# Patient Record
Sex: Female | Born: 1975
Health system: Southern US, Community
[De-identification: ages and names within clinical notes are randomized; demographics above are authoritative.]

## PROBLEM LIST (undated history)

## (undated) DIAGNOSIS — F419 Anxiety disorder, unspecified: Secondary | ICD-10-CM

## (undated) DIAGNOSIS — R0602 Shortness of breath: Secondary | ICD-10-CM

## (undated) DIAGNOSIS — D649 Anemia, unspecified: Secondary | ICD-10-CM

## (undated) DIAGNOSIS — Z9289 Personal history of other medical treatment: Secondary | ICD-10-CM

## (undated) DIAGNOSIS — K219 Gastro-esophageal reflux disease without esophagitis: Secondary | ICD-10-CM

## (undated) DIAGNOSIS — T7840XA Allergy, unspecified, initial encounter: Secondary | ICD-10-CM

## (undated) DIAGNOSIS — N809 Endometriosis, unspecified: Secondary | ICD-10-CM

## (undated) DIAGNOSIS — R51 Headache: Secondary | ICD-10-CM

## (undated) DIAGNOSIS — R011 Cardiac murmur, unspecified: Secondary | ICD-10-CM

## (undated) HISTORY — DX: Anemia, unspecified: D64.9

## (undated) HISTORY — DX: Personal history of other medical treatment: Z92.89

## (undated) HISTORY — DX: Headache: R51

## (undated) HISTORY — DX: Gastro-esophageal reflux disease without esophagitis: K21.9

## (undated) HISTORY — PX: LAPAROSCOPIC OVARIAN CYSTECTOMY: SHX6248

## (undated) HISTORY — DX: Allergy, unspecified, initial encounter: T78.40XA

## (undated) HISTORY — DX: Cardiac murmur, unspecified: R01.1

## (undated) HISTORY — DX: Endometriosis, unspecified: N80.9

---

## 1998-06-27 ENCOUNTER — Other Ambulatory Visit: Admission: RE | Admit: 1998-06-27 | Discharge: 1998-06-27 | Payer: Self-pay | Admitting: Obstetrics

## 1998-12-28 ENCOUNTER — Other Ambulatory Visit: Admission: RE | Admit: 1998-12-28 | Discharge: 1998-12-28 | Payer: Self-pay | Admitting: Obstetrics

## 2000-05-16 ENCOUNTER — Other Ambulatory Visit: Admission: RE | Admit: 2000-05-16 | Discharge: 2000-05-16 | Payer: Self-pay | Admitting: Obstetrics

## 2000-12-30 ENCOUNTER — Other Ambulatory Visit: Admission: RE | Admit: 2000-12-30 | Discharge: 2000-12-30 | Payer: Self-pay | Admitting: Obstetrics

## 2002-01-21 ENCOUNTER — Emergency Department (HOSPITAL_COMMUNITY): Admission: EM | Admit: 2002-01-21 | Discharge: 2002-01-21 | Payer: Self-pay | Admitting: Emergency Medicine

## 2002-01-27 ENCOUNTER — Emergency Department (HOSPITAL_COMMUNITY): Admission: EM | Admit: 2002-01-27 | Discharge: 2002-01-28 | Payer: Self-pay | Admitting: Emergency Medicine

## 2002-11-25 ENCOUNTER — Emergency Department (HOSPITAL_COMMUNITY): Admission: EM | Admit: 2002-11-25 | Discharge: 2002-11-25 | Payer: Self-pay | Admitting: Emergency Medicine

## 2003-10-07 ENCOUNTER — Encounter: Payer: Self-pay | Admitting: Obstetrics

## 2003-10-07 ENCOUNTER — Ambulatory Visit (HOSPITAL_COMMUNITY): Admission: RE | Admit: 2003-10-07 | Discharge: 2003-10-07 | Payer: Self-pay | Admitting: Obstetrics

## 2005-08-15 ENCOUNTER — Other Ambulatory Visit: Admission: RE | Admit: 2005-08-15 | Discharge: 2005-08-15 | Payer: Self-pay | Admitting: Family Medicine

## 2005-09-11 ENCOUNTER — Ambulatory Visit (HOSPITAL_COMMUNITY): Admission: RE | Admit: 2005-09-11 | Discharge: 2005-09-11 | Payer: Self-pay | Admitting: *Deleted

## 2005-11-15 ENCOUNTER — Encounter: Admission: RE | Admit: 2005-11-15 | Discharge: 2005-11-15 | Payer: Self-pay | Admitting: Family Medicine

## 2007-03-22 ENCOUNTER — Emergency Department (HOSPITAL_COMMUNITY): Admission: EM | Admit: 2007-03-22 | Discharge: 2007-03-22 | Payer: Self-pay | Admitting: Emergency Medicine

## 2008-01-04 ENCOUNTER — Emergency Department (HOSPITAL_COMMUNITY): Admission: EM | Admit: 2008-01-04 | Discharge: 2008-01-04 | Payer: Self-pay | Admitting: Emergency Medicine

## 2008-02-27 ENCOUNTER — Other Ambulatory Visit: Admission: RE | Admit: 2008-02-27 | Discharge: 2008-02-27 | Payer: Self-pay | Admitting: Family Medicine

## 2009-03-28 ENCOUNTER — Other Ambulatory Visit: Admission: RE | Admit: 2009-03-28 | Discharge: 2009-03-28 | Payer: Self-pay | Admitting: Family Medicine

## 2010-02-27 ENCOUNTER — Encounter (INDEPENDENT_AMBULATORY_CARE_PROVIDER_SITE_OTHER): Payer: Self-pay | Admitting: *Deleted

## 2010-03-09 ENCOUNTER — Encounter (INDEPENDENT_AMBULATORY_CARE_PROVIDER_SITE_OTHER): Payer: Self-pay | Admitting: *Deleted

## 2010-03-27 ENCOUNTER — Ambulatory Visit: Payer: Self-pay | Admitting: Gastroenterology

## 2010-03-27 DIAGNOSIS — R1032 Left lower quadrant pain: Secondary | ICD-10-CM | POA: Insufficient documentation

## 2010-03-27 DIAGNOSIS — R142 Eructation: Secondary | ICD-10-CM

## 2010-03-27 DIAGNOSIS — R141 Gas pain: Secondary | ICD-10-CM | POA: Insufficient documentation

## 2010-03-27 DIAGNOSIS — R143 Flatulence: Secondary | ICD-10-CM

## 2010-06-21 ENCOUNTER — Emergency Department (HOSPITAL_COMMUNITY): Admission: EM | Admit: 2010-06-21 | Discharge: 2010-06-21 | Payer: Self-pay | Admitting: Family Medicine

## 2011-01-16 NOTE — Letter (Signed)
Summary: New Patient letter  [facility]  [address] [place], [place] [address]   Phone: [phone]  Fax: [phone]       03/09/2010 MRN: [id number]  [place] [address]   [place], [place]  [address]  Dear Ms. Parsell,  Welcome to the Gastroenterology Division at [facility].    You are scheduled to see Dr.  Russella Dar on 03-27-10 at 3:00p.m. on the 3rd floor at [facility], [address]  We ask that you try to arrive at our office 15 minutes prior to your appointment time to allow for check-in.  We would like you to complete the enclosed self-administered evaluation form prior to your visit and bring it with you on the day of your appointment.  We will review it with you.  Also, please bring a complete list of all your medications or, if you prefer, bring the medication bottles and we will list them.  Please bring your insurance card so that we may make a copy of it.  If your insurance requires a referral to see a specialist, please bring your referral form from your primary care physician.  Co-payments are due at the time of your visit and may be paid by cash, check or credit card.     Your office visit will consist of a consult with your physician (includes a physical exam), any laboratory testing he/she may order, scheduling of any necessary diagnostic testing (e.g. x-ray, ultrasound, CT-scan), and scheduling of a procedure (e.g. Endoscopy, Colonoscopy) if required.  Please allow enough time on your schedule to allow for any/all of these possibilities.    If you cannot keep your appointment, please call [phone] to cancel or reschedule prior to your appointment date.  This allows [place] the opportunity to schedule an appointment for another patient in need of care.  If you do not cancel or reschedule by 5 p.m. the business day prior to your appointment date, you will be charged a $50.00 late cancellation/no-show fee.    Thank you for choosing  [facility] for your medical needs.  We appreciate the opportunity to care for you.  Please visit [place] at our website  to learn more about our practice.                     Sincerely,                                                             The Gastroenterology Division

## 2011-01-16 NOTE — Assessment & Plan Note (Signed)
Summary: abdominal pain--ch.   History of Present Illness Visit Type: new patient  Primary GI MD: Elie Goody MD [facility] Primary Provider: Candyce Churn. Allyne Gee, MD  Requesting Provider: n/a Chief Complaint: Lower abd pain, gas, nausea, and pain when having a bowel movement  History of Present Illness:   This is a 35 year old female, who relates a one month history of left lower quadrant pain, associated with bowel movements. The pain starts just prior to a bowel movement and is relieved within one minute of having a bowel movement. She has a bowel movement about every 2-3 days and this pattern has not changed. She denies straining, change in stool caliber or hematochezia. She has not noted any significant dietary changes. She has had worsening problems with gas and bloating for approximately 4-6 weeks. She takes Gas-X intermittently with some relief of symptoms.   GI Review of Systems    Reports abdominal pain, bloating, and  nausea.     Location of  Abdominal pain: LLQ.    Denies acid reflux, belching, chest pain, dysphagia with liquids, dysphagia with solids, heartburn, loss of appetite, vomiting, vomiting blood, weight loss, and  weight gain.        Denies anal fissure, black tarry stools, change in bowel habit, constipation, diarrhea, diverticulosis, fecal incontinence, heme positive stool, hemorrhoids, irritable bowel syndrome, jaundice, light color stool, liver problems, rectal bleeding, and  rectal pain.   Current Medications (verified): 1)  Aleve 220 Mg Tabs (Naproxen Sodium) .... As Needed For Headaches 2)  Ibuprofen 800 Mg Tabs (Ibuprofen) .... As Needed  Allergies (verified): No Known Drug Allergies  Past History:  Past Medical History: Arthritis Chronic Headaches  Past Surgical History: Unremarkable  Family History: No FH of Colon Cancer: Family History of Colon Polyps:Mother  Family History of Diabetes: Maternal Aunt   Social History: Occupation: [employer] one  child Patient is a former smoker.  Alcohol Use - yes: occ  Illicit Drug Use - no Smoking Status:  quit Drug Use:  no  Review of Systems       The patient complains of fatigue and headaches-new.         The pertinent positives and negatives are noted as above and in the HPI. All other ROS were reviewed and were negative.   Vital Signs:  Patient profile:   35 year old female Height:      64 inches Weight:      212 pounds BMI:     36.52 BSA:     2.01 Pulse rate:   88 / minute Pulse rhythm:   regular BP sitting:   128 / 74  (left arm) Cuff size:   regular  Vitals Entered By: Ok Anis CMA (March 27, 2010 2:40 PM)  Physical Exam  General:  Well developed, well nourished, no acute distress. Head:  Normocephalic and atraumatic. Eyes:  PERRLA, no icterus. Ears:  Normal auditory acuity. Mouth:  No deformity or lesions, dentition normal. Neck:  Supple; no masses or thyromegaly. Lungs:  Clear throughout to auscultation. Heart:  Regular rate and rhythm; no murmurs, rubs,  or bruits. Abdomen:  Soft, nontender and nondistended. No masses, hepatosplenomegaly or hernias noted. Normal bowel sounds. Rectal:  Normal exam. hemocult negative.   Msk:  Symmetrical with no gross deformities. Normal posture. Pulses:  Normal pulses noted. Extremities:  No clubbing, cyanosis, edema or deformities noted. Neurologic:  Alert and  oriented x4;  grossly normal neurologically. Cervical Nodes:  No significant cervical adenopathy. Inguinal Nodes:  No significant inguinal adenopathy. Psych:  Alert and cooperative. Normal mood and affect.  Impression & Recommendations:  Problem # 1:  ABDOMINAL PAIN-LLQ (ICD-789.04) New onset left lower quadrant pain without clear etiology. Rule out diverticulosis, and inflammatory bowel disease and bowel spasm. The risks, benefits and alternatives to sgimoidoscopy with possible biopsy and possible polypectomy were discussed with the patient and they consent to  proceed. The procedure will be scheduled electively.  Problem # 2:  FLATULENCE-GAS-BLOATING (ICD-787.3) Begin a low gas diet and Align once daily. Continue Gas-X q.i.d. p.r.n.  Patient Instructions: 1)  Call back to schedule Flexible Sigmoidoscopy when you find out your availability.  2)  Excessive Gas Diet handout given.  3)  Start Align one tablet by mouth once daily x 1 month.  4)  Copy sent to : Dorothyann Peng, MD 5)  The medication list was reviewed and reconciled.  All changed / newly prescribed medications were explained.  A complete medication list was provided to the patient / caregiver.

## 2011-01-16 NOTE — Letter (Signed)
Summary: New Patient letter  [facility]  [address] [place], [place] [address]   Phone: [phone]  Fax: [phone]       02/27/2010 MRN: [id number]  [address]IS[address] [place], [place]  [address]  Dear Ms. Duke,  Welcome to the Gastroenterology Division at [facility].    You are scheduled to see Dr. Russella Dar on 03-27-10 at 3:00p.m. on the 3rd floor at [facility], [address].  We ask that you try to arrive at our office 15 minutes prior to your appointment time to allow for check-in.  We would like you to complete the enclosed self-administered evaluation form prior to your visit and bring it with you on the day of your appointment.  We will review it with you.  Also, please bring a complete list of all your medications or, if you prefer, bring the medication bottles and we will list them.  Please bring your insurance card so that we may make a copy of it.  If your insurance requires a referral to see a specialist, please bring your referral form from your primary care physician.  Co-payments are due at the time of your visit and may be paid by cash, check or credit card.     Your office visit will consist of a consult with your physician (includes a physical exam), any laboratory testing he/she may order, scheduling of any necessary diagnostic testing (e.g. x-ray, ultrasound, CT-scan), and scheduling of a procedure (e.g. Endoscopy, Colonoscopy) if required.  Please allow enough time on your schedule to allow for any/all of these possibilities.    If you cannot keep your appointment, please call [phone] to cancel or reschedule prior to your appointment date.  This allows [place] the opportunity to schedule an appointment for another patient in need of care.  If you do not cancel or reschedule by 5 p.m. the business day prior to your appointment date, you will be charged a $50.00 late cancellation/no-show fee.    Thank you for  choosing [facility] for your medical needs.  We appreciate the opportunity to care for you.  Please visit [place] at our website  to learn more about our practice.                     Sincerely,                                                             The Gastroenterology Division

## 2011-03-04 LAB — GC/CHLAMYDIA PROBE AMP, GENITAL
Chlamydia, DNA Probe: NEGATIVE
GC Probe Amp, Genital: NEGATIVE

## 2011-03-04 LAB — WET PREP, GENITAL
Clue Cells Wet Prep HPF POC: NONE SEEN
Trich, Wet Prep: NONE SEEN
Yeast Wet Prep HPF POC: NONE SEEN

## 2011-03-04 LAB — POCT URINALYSIS DIP (DEVICE)
Glucose, UA: NEGATIVE mg/dL
Protein, ur: NEGATIVE mg/dL

## 2011-03-04 LAB — URINE CULTURE

## 2011-05-25 ENCOUNTER — Encounter: Payer: Self-pay | Admitting: Internal Medicine

## 2011-05-25 ENCOUNTER — Ambulatory Visit (INDEPENDENT_AMBULATORY_CARE_PROVIDER_SITE_OTHER): Payer: 59 | Admitting: Internal Medicine

## 2011-05-25 DIAGNOSIS — R002 Palpitations: Secondary | ICD-10-CM | POA: Insufficient documentation

## 2011-05-25 DIAGNOSIS — E7800 Pure hypercholesterolemia, unspecified: Secondary | ICD-10-CM | POA: Insufficient documentation

## 2011-05-25 DIAGNOSIS — R42 Dizziness and giddiness: Secondary | ICD-10-CM

## 2011-05-25 DIAGNOSIS — Z136 Encounter for screening for cardiovascular disorders: Secondary | ICD-10-CM

## 2011-05-25 LAB — LIPID PANEL
HDL: 55.1 mg/dL (ref >39.00)
Triglycerides: 39 mg/dL (ref 0.0–149.0)
VLDL: 7.8 mg/dL (ref 0.0–40.0)

## 2011-05-25 LAB — CBC WITH DIFFERENTIAL/PLATELET
Eosinophils Relative: 3.3 % (ref 0.0–5.0)
Lymphocytes Relative: 35.7 % (ref 12.0–46.0)
Lymphs Abs: 1.5 10*3/uL (ref 0.7–4.0)
MCV: 82.3 fl (ref 78.0–100.0)
Monocytes Relative: 7 % (ref 3.0–12.0)
Neutro Abs: 2.2 10*3/uL (ref 1.4–7.7)
Neutrophils Relative %: 53.4 % (ref 43.0–77.0)
Platelets: 224 10*3/uL (ref 150.0–400.0)
RBC: 4.12 Mil/uL (ref 3.87–5.11)
RDW: 13.9 % (ref 11.5–14.6)
WBC: 4.1 10*3/uL — ABNORMAL LOW (ref 4.5–10.5)

## 2011-05-25 LAB — BASIC METABOLIC PANEL
Creatinine, Ser: 0.7 mg/dL (ref 0.4–1.2)
Glucose, Bld: 87 mg/dL (ref 70–99)

## 2011-05-25 LAB — TSH: TSH: 0.78 u[IU]/mL (ref 0.35–5.50)

## 2011-05-25 NOTE — Progress Notes (Signed)
  Subjective:    Patient ID: Theresa Murray, female    DOB: 1976/11/17, 35 y.o.   MRN: [id number]  HPI patient presents to clinic to establish primary care and for evaluation of palpitations and dizziness. Has rare palpitations not associated with presyncope or syncope. Describes the sensation as a skipping. Drinks coffee daily but not to excess. No associated chest pain or shortness of breath though she does have mild shortness of breath with exertion climbing stairs. Has had her cholesterol checked in the past and was told to modify her diet. No medication was recommended. History of anemia in the past which resolved on followup. No gross active bleeding. Does have intermittent episodes of brief dizziness or vertigo approximately several seconds followed by spontaneous resolution. No obvious trigger or alleviating or exacerbating factors. Sees gynecology for Pap smears and is up to date. No complaints  Reviewed past medical history, medications, allergies, past surgical history, social history, and family history  Review of Systems  Constitutional: Negative for fatigue and unexpected weight change.  Respiratory: Positive for shortness of breath. Negative for cough and wheezing.   Cardiovascular: Positive for palpitations. Negative for chest pain.  Neurological: Positive for dizziness. Negative for seizures, syncope and weakness.  All other systems reviewed and are negative.       Objective:   Physical Exam    Physical Exam  [nursing notereviewed. Constitutional:  appears well-developed and well-nourished. No distress.  HENT: Pupils equal round reactive to light, EOM grossly intact. Head: Normocephalic and atraumatic.  Right Ear: Tympanic membrane, external ear and ear canal normal.  Left Ear: Tympanic membrane, external ear and ear canal normal.  Nose: Nose normal.  Mouth/Throat: Oropharynx is clear and moist. No oropharyngeal exudate.  Eyes: Conjunctivae are normal. No scleral icterus.    Neck: Neck supple.  Cardiovascular: Normal rate, regular rhythm and normal heart sounds.  Exam reveals no gallop and no friction rub.   No murmur heard. Pulmonary/Chest: Effort normal and breath sounds normal. No respiratory distress.  no wheezes.  no rales.  Abdomen: Soft, nondistended, nontender to palpation, positive bowel sounds. No masses organomegaly Lymphadenopathy:     no cervical adenopathy.  Neurological:  alert.  Skin: Skin is warm and dry.  not diaphoretic.      Assessment & Plan:

## 2011-05-25 NOTE — Assessment & Plan Note (Signed)
Obtain CBC, Chem-7 and TSH

## 2011-05-25 NOTE — Assessment & Plan Note (Signed)
Obtain fasting profile. Recommended low fat diet, regular aerobic exercise and weight loss.

## 2011-05-25 NOTE — Assessment & Plan Note (Signed)
Current asymptomatic. EKG obtained demonstrates normal sinus rhythm with a rate of approximate sixty-one. Intervals and axis normal. No evidence of arrhythmia or obvious ischemic change. Obtain Chem-7

## 2011-05-29 ENCOUNTER — Telehealth: Payer: Self-pay

## 2011-05-29 NOTE — Telephone Encounter (Signed)
Pt.notified

## 2011-05-29 NOTE — Telephone Encounter (Signed)
Message copied by Beverely Low on Tue May 29, 2011  4:43 PM ------      Message from: Staci Righter      Created: Mon May 28, 2011  7:52 PM       Labs nl

## 2011-09-28 ENCOUNTER — Inpatient Hospital Stay (INDEPENDENT_AMBULATORY_CARE_PROVIDER_SITE_OTHER)
Admission: RE | Admit: 2011-09-28 | Discharge: 2011-09-28 | Disposition: A | Discharge status: Home / Self Care | Payer: 59 | Source: Ambulatory Visit | Attending: Emergency Medicine | Admitting: Emergency Medicine

## 2011-09-28 DIAGNOSIS — R6889 Other general symptoms and signs: Secondary | ICD-10-CM

## 2011-09-28 DIAGNOSIS — T6391XA Toxic effect of contact with unspecified venomous animal, accidental (unintentional), initial encounter: Secondary | ICD-10-CM

## 2011-12-18 HISTORY — PX: LAPAROSCOPIC OVARIAN CYSTECTOMY: SHX6248

## 2012-02-22 ENCOUNTER — Encounter: Payer: Self-pay | Admitting: Internal Medicine

## 2012-02-22 ENCOUNTER — Ambulatory Visit (INDEPENDENT_AMBULATORY_CARE_PROVIDER_SITE_OTHER): Payer: 59 | Admitting: Internal Medicine

## 2012-02-22 DIAGNOSIS — R079 Chest pain, unspecified: Secondary | ICD-10-CM | POA: Insufficient documentation

## 2012-02-22 DIAGNOSIS — D649 Anemia, unspecified: Secondary | ICD-10-CM

## 2012-02-22 DIAGNOSIS — M79602 Pain in left arm: Secondary | ICD-10-CM

## 2012-02-22 DIAGNOSIS — M79609 Pain in unspecified limb: Secondary | ICD-10-CM

## 2012-02-22 DIAGNOSIS — R42 Dizziness and giddiness: Secondary | ICD-10-CM

## 2012-02-22 LAB — POCT HEMOGLOBIN: Hemoglobin: 11.3 g/dL — AB (ref 12.2–16.2)

## 2012-02-22 NOTE — Progress Notes (Signed)
  Subjective:    Patient ID: Theresa Murray, female    DOB: August 05, 1976, 36 y.o.   MRN: [id number]  HPI Patient comes in as new transfer patient transfer  visit . Previous care was  Dr Rodena Medin in June 2012. .  For palpitations that have subsided and had  Unrevealing evaluation with ekg and labs Previous care [facility] with NP 2 or more years ago.  Comes in today  because of New onset mid chest pain a week ago and feels better if  massages the area  Feels more like a Tooth aches ; off and on for 2 weeks no incitors  ? If stress  Left arm feel funny with it and then to right arm.   No weakness SOB or sweating . Right handed.   No nocturnal sx  .  No exercise related.  Not eating related.  "No migraine  " but does have hx of some HA s. Works at [employer] and says Bright.  lights bothers her and gets a mild  ha  But then vertigo described as dizzy but no falling some nausea.   Has eye doc check.  Vertigo lasts 2 days .   Has not often   And takes meds.  Aleve prn with help. Caffine   In am only.   Mom with HT. No cva or heart attacks   Sis well .  Review of Systems Neg fever weight loss vomiting vision changes bleeding . Has hx of ovarian cysts no current issues . Hx of heart murmur.  No heart disease. Allergies and uses nasal steroid for poss ha  dizzy suppression. No osa   Past history family history social history reviewed updated  in the electronic medical record.     Objective:   Physical Exam WDWN in nad HEENT: Normocephalic ;atraumatic , Eyes;  PERRL, EOMs  Full, lids and conjunctiva clear,,Ears: no deformities, canals nl, TM landmarks normal, Nose: no deformity or discharge mild congestion  Mouth : OP clear without lesion or edema . Neck: Supple without adenopathy or masses or bruits Chest:  Clear to A&P without wheezes rales or rhonchi  ? Tenderness around left parasternal area  CV:  S1-S2 no gallops or murmurs peripheral perfusion is normal Breast:  Pendulous breasts normal by  inspection . No dimpling, discharge, masses, tenderness or discharge . Abdomen:  Sof,t normal bowel sounds without hepatosplenomegaly, no guarding rebound or masses no CVA tenderness NEURO: oriented x 3 CN 3-12 appear intact. No focal muscle weakness or atrophy. DTRs symmetrical. UE  Gait WNL.  Grossly non focal. No tremor or abnormal movement. EKG nsr      Assessment & Plan:  Chest pain atypical  Doesn't sound cv pulm  But get c xray  ? If ms ? cw ? Gynecomastia contributing  Arm pains  Also . 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 Again normal exam   Anemia from last labs  Will recheck

## 2012-02-22 NOTE — Patient Instructions (Signed)
Her chest pain does not seem to be related to heart disease and not lung disease but we should get a chest x-ray to be sure. It is possible you have a pinched nerve down your arm and even chest wall pain is a possibility.    At this time I recommend anti-inflammatory medication for week or so to take 2 Aleve twice a day.   And if not improving in the next few weeks She returned for followup.  Her last laboratory studies were normal except for mild anemia. We should follow this up also  The sensitivity to light still sounds like some type of migraine variant.  I would have you try these spells and episodes including her headaches and come back with the information.  Avoid excess caffeine and alcohol.

## 2012-02-23 ENCOUNTER — Encounter: Payer: Self-pay | Admitting: Internal Medicine

## 2012-02-23 DIAGNOSIS — R42 Dizziness and giddiness: Secondary | ICD-10-CM | POA: Insufficient documentation

## 2012-02-25 ENCOUNTER — Encounter: Payer: Self-pay | Admitting: *Deleted

## 2012-02-25 NOTE — Progress Notes (Signed)
Quick Note:    Letter sent to pt.  ______

## 2012-02-27 ENCOUNTER — Telehealth: Payer: Self-pay | Admitting: Family Medicine

## 2012-02-27 NOTE — Telephone Encounter (Signed)
Misty Stanley from [facility] radiology called. She wanted you to know that this patient never came in to get the films you ordered on 3/8. Thank you.

## 2012-02-27 NOTE — Telephone Encounter (Signed)
Per Dr. Fabian Sharp- call pt to see how she is doing and did she get a x-ray done.

## 2012-02-28 NOTE — Telephone Encounter (Signed)
I tried to call pt on both home and cell number but couldn't leave a message. I mailed pt a letter to call office.

## 2012-03-18 NOTE — Telephone Encounter (Signed)
FYI:  Pt states she will go get chest x-ray on Friday 03/21/12.

## 2012-03-25 ENCOUNTER — Encounter: Payer: Self-pay | Admitting: Internal Medicine

## 2012-03-25 ENCOUNTER — Ambulatory Visit (INDEPENDENT_AMBULATORY_CARE_PROVIDER_SITE_OTHER): Payer: 59 | Admitting: Internal Medicine

## 2012-03-25 ENCOUNTER — Ambulatory Visit: Payer: 59 | Admitting: Internal Medicine

## 2012-03-25 VITALS — BP 104/78 | HR 77 | Temp 98.3°F | Wt 212.0 lb

## 2012-03-25 DIAGNOSIS — J069 Acute upper respiratory infection, unspecified: Secondary | ICD-10-CM

## 2012-03-25 DIAGNOSIS — J309 Allergic rhinitis, unspecified: Secondary | ICD-10-CM

## 2012-03-25 MED ORDER — FLUTICASONE FUROATE 27.5 MCG/SPRAY NA SUSP: 2.0000 | Freq: Every day | NASAL | Status: DC

## 2012-03-25 MED ORDER — HYDROCODONE-HOMATROPINE 5-1.5 MG/5ML PO SYRP: 5.0000 mL | ORAL_SOLUTION | ORAL | Status: AC | PRN

## 2012-03-25 NOTE — Progress Notes (Signed)
  Subjective:    Patient ID: Theresa Murray, female    DOB: 1975/12/30, 36 y.o.   MRN: [id number]  HPI Patient comes in today for SDA for  new problem evaluation. Onset of  5 days of ur congestion hoarseness and some cough  No fever cp sob itchy throat  Some green drainage  No face pain. No nose itching .   Using mucinex  Still present  Hasnt been using veramyst as is out of this .    Review of Systems Neg fever cp sob  No NVD new rash or swelling Past history family history social history reviewed in the electronic medical record.     Objective:   Physical Exam BP 104/78  Pulse 77  Temp(Src) 98.3 F (36.8 C) (Oral)  Wt 212 lb (96.163 kg)  SpO2 99%  LMP 03/21/2012 WDWN in NAD  quiet respirations; mildly congested  somewhat hoarse. Non toxic . HEENT: Normocephalic ;atraumatic , Eyes;  PERRL, EOMs  Full, lids and conjunctiva clear,,Ears: no deformities, canals nl, TM landmarks normal, Nose: no deformity or discharge but congested;face non  tender Mouth : OP clear without lesion or edema . Neck: Supple without adenopathy or masses or bruits Chest:  Clear to A&P without wheezes rales or rhonchi CV:  S1-S2 no gallops or murmurs peripheral perfusion is normal Skin :nl perfusion and no acute rashes      Assessment & Plan:   Acute URI prob viral however may have allergy component  With the itchy throat sx  and context of high pollen counts    Counseled. At length about viral infections and allergy  No indication for antibiotic at this time but if  persistent or progressive  Or alarm features will follow up . Refill veramyst and restart.  Cough med as needed.

## 2012-03-25 NOTE — Patient Instructions (Addendum)
I believe that you had a viral respiratory infection that may last another week or so;  should get better on its own but we can try some things to make you  more comfortable  In the meantime. You should contact us if you have significant fever shortness of breath not related to coughing ,wheezing or other concerns. Antibiotics will not help a viral respiratory infection. They  only help kill  bacteria of certain types. Can be harmful in some situations.   You probably also has some allergy involved in your symptoms. Take the veramyst every day  And continue on antihistamine such as allegra zyrtec or claritin.  Can use cough med at night for comfort . will not cure the cough .   Warm or hot liquids can help soothe the throat until you get better.   Laryngitis At the top of your windpipe is your voice box. It is the source of your voice. Inside your voice box are 2 bands of muscles called vocal cords. When you breathe, your vocal cords are relaxed and open so that air can get into the lungs. When you decide to say something, these cords come together and vibrate. The sound from these vibrations goes into your throat and comes out through your mouth as sound. Laryngitis is an inflammation of the vocal cords that causes hoarseness, cough, loss of voice, sore throat, and dry throat. Laryngitis can be temporary (acute) or long-term (chronic). Most cases of acute laryngitis improve with time.Chronic laryngitis lasts for more than 3 weeks. CAUSES Laryngitis can often be related to excessive smoking, talking, or yelling, as well as inhalation of toxic fumes and allergies. Acute laryngitis is usually caused by a viral infection, vocal strain, measles or mumps, or bacterial infections. Chronic laryngitis is usually caused by vocal cord strain, vocal cord injury, postnasal drip, growths on the vocal cords, or acid reflux. SYMPTOMS   Cough.   Sore throat.   Dry throat.  RISK FACTORS  Respiratory  infections.   Exposure to irritating substances, such as cigarette smoke, excessive amounts of alcohol, stomach acids, and workplace chemicals.   Voice trauma, such as vocal cord injury from shouting or speaking too loud.  DIAGNOSIS  Your cargiver will perform a physical exam. During the physical exam, your caregiver will examine your throat. The most common sign of laryngitis is hoarseness. Laryngoscopy may be necessary to confirm the diagnosis of this condition. This procedure allows your caregiver to look into the larynx. HOME CARE INSTRUCTIONS  Drink enough fluids to keep your urine clear or pale yellow.   Rest until you no longer have symptoms or as directed by your caregiver.   Breathe in moist air.   Take all medicine as directed by your caregiver.   Do not smoke.   Talk as little as possible (this includes whispering).   Write on paper instead of talking until your voice is back to normal.   Follow up with your caregiver if your condition has not improved after 10 days.  SEEK MEDICAL CARE IF:   You have trouble breathing.   You cough up blood.   You have persistent fever.   You have increasing pain.   You have difficulty swallowing.  MAKE SURE YOU:  Understand these instructions.   Will watch your condition.   Will get help right away if you are not doing well or get worse.  Document Released: 12/03/2005 Document Revised: 11/22/2011 Document Reviewed: 02/08/2011 [facility] Patient Information 2012 [place], [place].  Acute Bronchitis Acute bronchitis is usually  a chest cold. The airways in your lungs are red and sore (inflamed). Acute means it is sudden onset.  CAUSES Bronchitis is most often caused by the same virus that causes a cold. SYMPTOMS   Body aches.   Chest congestion.   Chills.   Cough.   Fever.   Shortness of breath.   Sore throat.  TREATMENT  Acute bronchitis is usually treated with rest, fluids, and medicines for relief of fever or  cough. Most symptoms should go away after a few days or a week. Increased fluids may help thin your secretions and will prevent dehydration. Your caregiver may give you an inhaler to improve your symptoms. The inhaler reduces shortness of breath and helps control cough. You can take over-the-counter pain relievers or cough medicine to decrease coughing, pain, or fever. A cool-air vaporizer may help thin bronchial secretions and make it easier to clear your chest. Antibiotics are usually not needed but can be prescribed if you smoke, are seriously ill, have chronic lung problems, are elderly, or you are at higher risk for developing complications.Allergies and asthma can make bronchitis worse. Repeated episodes of bronchitis may cause longstanding lung problems. Avoid smoking and secondhand smoke.Exposure to cigarette smoke or irritating chemicals will make bronchitis worse. If you are a cigarette smoker, consider using nicotine gum or skin patches to help control withdrawal symptoms. Quitting smoking will help your lungs heal faster. Recovery from bronchitis is often slow, but you should start feeling better after 2 to 3 days. Cough from bronchitis frequently lasts for 3 to 4 weeks. To prevent another bout of acute bronchitis:  Quit smoking.   Wash your hands frequently to get rid of viruses or use a hand sanitizer.   Avoid other people with cold or virus symptoms.   Try not to touch your hands to your mouth, nose, or eyes.  SEEK IMMEDIATE MEDICAL CARE IF:  You develop increased fever, chills, or chest pain.   You have severe shortness of breath or bloody sputum.   You develop dehydration, fainting, repeated vomiting, or a severe headache.   You have no improvement after 1 week of treatment or you get worse.  MAKE SURE YOU:   Understand these instructions.   Will watch your condition.   Will get help right away if you are not doing well or get worse.  Document Released: 01/10/2005  Document Revised: 11/22/2011 Document Reviewed: 03/28/2011 [facility] Patient Information 2012 [place], [place].

## 2012-03-27 ENCOUNTER — Ambulatory Visit: Payer: 59 | Admitting: Internal Medicine

## 2012-03-30 DIAGNOSIS — J069 Acute upper respiratory infection, unspecified: Secondary | ICD-10-CM | POA: Insufficient documentation

## 2012-03-30 DIAGNOSIS — J309 Allergic rhinitis, unspecified: Secondary | ICD-10-CM | POA: Insufficient documentation

## 2012-05-14 ENCOUNTER — Telehealth: Payer: Self-pay | Admitting: Family Medicine

## 2012-05-14 ENCOUNTER — Encounter (HOSPITAL_COMMUNITY): Payer: Self-pay | Admitting: Pharmacist

## 2012-05-14 NOTE — Telephone Encounter (Signed)
Patient states she was fine and will call back because she was waiting on her daughter

## 2012-05-14 NOTE — Telephone Encounter (Signed)
We received notification that this patient did not get the chest xray that was ordered 02/22/12. Thank you.

## 2012-05-14 NOTE — Telephone Encounter (Signed)
Noted canceled FU visit and no cx ray. From  March April  Contact patient and assess status to document how she is doing.  .   If her pain is gone and no cough or chronic resp sx then we can see her prn or when needs preventive visit.

## 2012-05-29 ENCOUNTER — Other Ambulatory Visit: Payer: Self-pay | Admitting: Obstetrics and Gynecology

## 2012-06-04 ENCOUNTER — Encounter (HOSPITAL_COMMUNITY): Payer: Self-pay

## 2012-06-04 ENCOUNTER — Encounter (HOSPITAL_COMMUNITY)
Admission: RE | Admit: 2012-06-04 | Discharge: 2012-06-04 | Disposition: A | Discharge status: Home / Self Care | Payer: 59 | Source: Ambulatory Visit | Attending: Obstetrics and Gynecology | Admitting: Obstetrics and Gynecology

## 2012-06-04 HISTORY — DX: Shortness of breath: R06.02

## 2012-06-04 HISTORY — DX: Anxiety disorder, unspecified: F41.9

## 2012-06-04 LAB — CBC
Hemoglobin: 11.5 g/dL — ABNORMAL LOW (ref 12.0–15.0)
MCH: 25.3 pg — ABNORMAL LOW (ref 26.0–34.0)
MCHC: 32.3 g/dL (ref 30.0–36.0)
MCV: 78.4 fL (ref 78.0–100.0)
RBC: 4.54 MIL/uL (ref 3.87–5.11)
RDW: 14.3 % (ref 11.5–15.5)
WBC: 6.3 10*3/uL (ref 4.0–10.5)

## 2012-06-04 LAB — SURGICAL PCR SCREEN: Staphylococcus aureus: NEGATIVE

## 2012-06-04 NOTE — Patient Instructions (Addendum)
YOUR PROCEDURE IS SCHEDULED ON: 06/10/12  ENTER THROUGH THE MAIN ENTRANCE OF [facility] AT: 1130am  USE DESK PHONE AND DIAL [phone] TO INFORM us OF YOUR ARRIVAL  CALL [phone] IF YOU HAVE ANY QUESTIONS OR PROBLEMS PRIOR TO YOUR ARRIVAL.  REMEMBER: DO NOT EAT AFTER MIDNIGHT : Monday  SPECIAL INSTRUCTIONS:see clear liquids sheet   YOU MAY BRUSH YOUR TEETH THE MORNING OF SURGERY   TAKE THESE MEDICINES THE DAY OF SURGERY WITH SIP OF WATER:none   DO NOT WEAR JEWELRY, EYE MAKEUP, LIPSTICK OR DARK FINGERNAIL POLISH DO NOT WEAR BODY LOTIONS  DO NOT SHAVE FOR 48 HOURS PRIOR TO SURGERY  YOU WILL NOT BE ALLOWED TO DRIVE YOURSELF HOME.  NAME OF DRIVER: Audie Clear- [phone]

## 2012-06-10 ENCOUNTER — Encounter (HOSPITAL_COMMUNITY): Payer: Self-pay | Admitting: *Deleted

## 2012-06-10 ENCOUNTER — Ambulatory Visit (HOSPITAL_COMMUNITY): Payer: 59 | Admitting: Anesthesiology

## 2012-06-10 ENCOUNTER — Encounter (HOSPITAL_COMMUNITY): Payer: Self-pay | Admitting: Anesthesiology

## 2012-06-10 ENCOUNTER — Encounter (HOSPITAL_COMMUNITY)
Admission: RE | Disposition: A | Discharge status: Home / Self Care | Payer: Self-pay | Source: Ambulatory Visit | Attending: Obstetrics and Gynecology

## 2012-06-10 ENCOUNTER — Ambulatory Visit (HOSPITAL_COMMUNITY)
Admission: RE | Admit: 2012-06-10 | Discharge: 2012-06-10 | Disposition: A | Discharge status: Home / Self Care | Payer: 59 | Source: Ambulatory Visit | Attending: Obstetrics and Gynecology | Admitting: Obstetrics and Gynecology

## 2012-06-10 DIAGNOSIS — Z01812 Encounter for preprocedural laboratory examination: Secondary | ICD-10-CM | POA: Insufficient documentation

## 2012-06-10 DIAGNOSIS — N803 Endometriosis of pelvic peritoneum, unspecified: Secondary | ICD-10-CM | POA: Insufficient documentation

## 2012-06-10 DIAGNOSIS — N801 Endometriosis of ovary: Secondary | ICD-10-CM | POA: Insufficient documentation

## 2012-06-10 DIAGNOSIS — N80109 Endometriosis of ovary, unspecified side, unspecified depth: Secondary | ICD-10-CM | POA: Insufficient documentation

## 2012-06-10 DIAGNOSIS — Z9889 Other specified postprocedural states: Secondary | ICD-10-CM

## 2012-06-10 DIAGNOSIS — Z01818 Encounter for other preprocedural examination: Secondary | ICD-10-CM | POA: Insufficient documentation

## 2012-06-10 LAB — PREGNANCY, URINE: Preg Test, Ur: NEGATIVE

## 2012-06-10 SURGERY — ROBOTIC ASSISTED LAPAROSCOPIC OVARIAN CYSTECTOMY
Anesthesia: General | Site: Abdomen | Laterality: Right | Wound class: Clean Contaminated

## 2012-06-10 MED ORDER — MIDAZOLAM HCL 5 MG/5ML IJ SOLN
INTRAMUSCULAR | Status: DC | PRN
  Administered 2012-06-10: 2 mg via INTRAVENOUS

## 2012-06-10 MED ORDER — BUPIVACAINE HCL (PF) 0.25 % IJ SOLN
INTRAMUSCULAR | Status: DC | PRN
  Administered 2012-06-10: 8 mL

## 2012-06-10 MED ORDER — LIDOCAINE HCL (CARDIAC) 20 MG/ML IV SOLN
INTRAVENOUS | Status: AC
  Filled 2012-06-10: qty 5

## 2012-06-10 MED ORDER — DEXAMETHASONE SODIUM PHOSPHATE 10 MG/ML IJ SOLN
INTRAMUSCULAR | Status: AC
  Filled 2012-06-10: qty 1

## 2012-06-10 MED ORDER — HYDROCODONE-ACETAMINOPHEN 5-500 MG PO TABS: 1.0000 | ORAL_TABLET | Freq: Four times a day (QID) | ORAL | Status: AC | PRN

## 2012-06-10 MED ORDER — MEPERIDINE HCL 25 MG/ML IJ SOLN: 6.2500 mg | INTRAMUSCULAR | Status: DC | PRN

## 2012-06-10 MED ORDER — HYDROCODONE-ACETAMINOPHEN 5-325 MG PO TABS
1.0000 | ORAL_TABLET | Freq: Once | ORAL | Status: AC
  Administered 2012-06-10: 1 via ORAL

## 2012-06-10 MED ORDER — DEXAMETHASONE SODIUM PHOSPHATE 4 MG/ML IJ SOLN
INTRAMUSCULAR | Status: DC | PRN
  Administered 2012-06-10: 4 mg via INTRAVENOUS
  Administered 2012-06-10: 10 mg via INTRAVENOUS

## 2012-06-10 MED ORDER — ONDANSETRON HCL 4 MG/2ML IJ SOLN
INTRAMUSCULAR | Status: AC
  Filled 2012-06-10: qty 2

## 2012-06-10 MED ORDER — NEOSTIGMINE METHYLSULFATE 1 MG/ML IJ SOLN
INTRAMUSCULAR | Status: DC | PRN
  Administered 2012-06-10: 2 mg via INTRAVENOUS

## 2012-06-10 MED ORDER — FENTANYL CITRATE 0.05 MG/ML IJ SOLN
INTRAMUSCULAR | Status: DC | PRN
  Administered 2012-06-10: 50 ug via INTRAVENOUS
  Administered 2012-06-10: 100 ug via INTRAVENOUS
  Administered 2012-06-10: 150 ug via INTRAVENOUS

## 2012-06-10 MED ORDER — GLYCOPYRROLATE 0.2 MG/ML IJ SOLN
INTRAMUSCULAR | Status: DC | PRN
  Administered 2012-06-10: 0.2 mg via INTRAVENOUS

## 2012-06-10 MED ORDER — SCOPOLAMINE 1 MG/3DAYS TD PT72
1.0000 | MEDICATED_PATCH | TRANSDERMAL | Status: DC
  Administered 2012-06-10: 1.5 mg via TRANSDERMAL

## 2012-06-10 MED ORDER — BUPIVACAINE HCL (PF) 0.25 % IJ SOLN
INTRAMUSCULAR | Status: AC
  Filled 2012-06-10: qty 30

## 2012-06-10 MED ORDER — PROPOFOL 10 MG/ML IV EMUL
INTRAVENOUS | Status: DC | PRN
  Administered 2012-06-10: 200 mg via INTRAVENOUS

## 2012-06-10 MED ORDER — ONDANSETRON HCL 4 MG/2ML IJ SOLN: INTRAMUSCULAR | Status: DC | PRN

## 2012-06-10 MED ORDER — PROPOFOL 10 MG/ML IV EMUL
INTRAVENOUS | Status: AC
  Filled 2012-06-10: qty 20

## 2012-06-10 MED ORDER — KETOROLAC TROMETHAMINE 30 MG/ML IJ SOLN: 15.0000 mg | Freq: Once | INTRAMUSCULAR | Status: DC | PRN

## 2012-06-10 MED ORDER — ROCURONIUM BROMIDE 50 MG/5ML IV SOLN
INTRAVENOUS | Status: AC
  Filled 2012-06-10: qty 1

## 2012-06-10 MED ORDER — IBUPROFEN 800 MG PO TABS: 800.0000 mg | ORAL_TABLET | Freq: Three times a day (TID) | ORAL | Status: AC | PRN

## 2012-06-10 MED ORDER — SCOPOLAMINE 1 MG/3DAYS TD PT72
MEDICATED_PATCH | TRANSDERMAL | Status: AC
  Filled 2012-06-10: qty 1

## 2012-06-10 MED ORDER — ONDANSETRON HCL 4 MG/2ML IJ SOLN: 4.0000 mg | Freq: Once | INTRAMUSCULAR | Status: DC | PRN

## 2012-06-10 MED ORDER — FENTANYL CITRATE 0.05 MG/ML IJ SOLN
INTRAMUSCULAR | Status: AC
  Filled 2012-06-10: qty 2

## 2012-06-10 MED ORDER — LACTATED RINGERS IR SOLN
Status: DC | PRN
  Administered 2012-06-10: 3000 mL

## 2012-06-10 MED ORDER — FENTANYL CITRATE 0.05 MG/ML IJ SOLN: 25.0000 ug | INTRAMUSCULAR | Status: DC | PRN

## 2012-06-10 MED ORDER — KETOROLAC TROMETHAMINE 30 MG/ML IJ SOLN
INTRAMUSCULAR | Status: DC | PRN
  Administered 2012-06-10: 30 mg via INTRAVENOUS

## 2012-06-10 MED ORDER — FENTANYL CITRATE 0.05 MG/ML IJ SOLN
INTRAMUSCULAR | Status: AC
  Filled 2012-06-10: qty 5

## 2012-06-10 MED ORDER — LACTATED RINGERS IV SOLN
INTRAVENOUS | Status: DC
  Administered 2012-06-10 (×3): via INTRAVENOUS

## 2012-06-10 MED ORDER — ROCURONIUM BROMIDE 100 MG/10ML IV SOLN
INTRAVENOUS | Status: DC | PRN
  Administered 2012-06-10: 50 mg via INTRAVENOUS
  Administered 2012-06-10: 5 mg via INTRAVENOUS

## 2012-06-10 MED ORDER — LIDOCAINE HCL (CARDIAC) 20 MG/ML IV SOLN
INTRAVENOUS | Status: DC | PRN
  Administered 2012-06-10: 50 mg via INTRAVENOUS

## 2012-06-10 MED ORDER — HYDROCODONE-ACETAMINOPHEN 5-325 MG PO TABS
ORAL_TABLET | ORAL | Status: AC
  Administered 2012-06-10: 1 via ORAL
  Filled 2012-06-10: qty 1

## 2012-06-10 MED ORDER — MIDAZOLAM HCL 2 MG/2ML IJ SOLN
INTRAMUSCULAR | Status: AC
  Filled 2012-06-10: qty 2

## 2012-06-10 SURGICAL SUPPLY — 70 items
ADH SKN CLS APL DERMABOND .7 (GAUZE/BANDAGES/DRESSINGS) ×1
BAG URINE DRAINAGE (UROLOGICAL SUPPLIES) ×2 IMPLANT
BARRIER ADHS 3X4 INTERCEED (GAUZE/BANDAGES/DRESSINGS) ×2 IMPLANT
BRR ADH 4X3 ABS CNTRL BYND (GAUZE/BANDAGES/DRESSINGS) ×1
CABLE HIGH FREQUENCY MONO STRZ (ELECTRODE) ×2 IMPLANT
CATH FOLEY 3WAY  5CC 16FR (CATHETERS) ×1
CATH FOLEY 3WAY 5CC 16FR (CATHETERS) ×1 IMPLANT
CHLORAPREP W/TINT 26ML (MISCELLANEOUS) ×2 IMPLANT
CONT PATH 16OZ SNAP LID 3702 (MISCELLANEOUS) ×2 IMPLANT
COVER MAYO STAND STRL (DRAPES) ×2 IMPLANT
COVER TABLE BACK 60X90 (DRAPES) ×4 IMPLANT
COVER TIP SHEARS 8 DVNC (MISCELLANEOUS) ×1 IMPLANT
COVER TIP SHEARS 8MM DA VINCI (MISCELLANEOUS) ×1
DECANTER SPIKE VIAL GLASS SM (MISCELLANEOUS) ×2 IMPLANT
DERMABOND ADVANCED (GAUZE/BANDAGES/DRESSINGS) ×1
DERMABOND ADVANCED .7 DNX12 (GAUZE/BANDAGES/DRESSINGS) ×1 IMPLANT
DRAPE HUG U DISPOSABLE (DRAPE) ×2 IMPLANT
DRAPE LG THREE QUARTER DISP (DRAPES) ×4 IMPLANT
DRAPE MONITOR DA VINCI (DRAPE) IMPLANT
DRAPE WARM FLUID 44X44 (DRAPE) ×2 IMPLANT
ELECT REM PT RETURN 9FT ADLT (ELECTROSURGICAL) ×2
ELECTRODE REM PT RTRN 9FT ADLT (ELECTROSURGICAL) ×1 IMPLANT
EVACUATOR SMOKE 8.L (FILTER) ×2 IMPLANT
GAUZE VASELINE 3X9 (GAUZE/BANDAGES/DRESSINGS) IMPLANT
GLOVE BIO SURGEON STRL SZ 6.5 (GLOVE) ×5 IMPLANT
GLOVE BIOGEL PI IND STRL 7.0 (GLOVE) ×3 IMPLANT
GLOVE BIOGEL PI INDICATOR 7.0 (GLOVE) ×3
GOWN STRL REIN XL XLG (GOWN DISPOSABLE) ×14 IMPLANT
HEMOSTAT SURGICEL 2X3 (HEMOSTASIS) ×1 IMPLANT
HEMOSTAT SURGICEL 4X8 (HEMOSTASIS) ×1 IMPLANT
KIT ACCESSORY DA VINCI DISP (KITS) ×1
KIT ACCESSORY DVNC DISP (KITS) ×1 IMPLANT
KIT DISP ACCESSORY 4 ARM (KITS) IMPLANT
LEGGING LITHOTOMY PAIR STRL (DRAPES) ×2 IMPLANT
NDL INSUFFLATION 14GA 120MM (NEEDLE) ×1 IMPLANT
NDL INSUFFLATION 14GA 150MM (NEEDLE) IMPLANT
NEEDLE INSUFFLATION 14GA 120MM (NEEDLE) ×2 IMPLANT
NEEDLE INSUFFLATION 14GA 150MM (NEEDLE) ×2 IMPLANT
NS IRRIG 1000ML POUR BTL (IV SOLUTION) ×3 IMPLANT
OCCLUDER COLPOPNEUMO (BALLOONS) ×1 IMPLANT
PACK LAVH (CUSTOM PROCEDURE TRAY) ×2 IMPLANT
PAD PREP 24X48 CUFFED NSTRL (MISCELLANEOUS) ×4 IMPLANT
PLUG CATH AND CAP STER (CATHETERS) ×2 IMPLANT
PROTECTOR NERVE ULNAR (MISCELLANEOUS) ×4 IMPLANT
SCISSORS LAP 5X35 DISP (ENDOMECHANICALS) IMPLANT
SET CYSTO W/LG BORE CLAMP LF (SET/KITS/TRAYS/PACK) IMPLANT
SET IRRIG TUBING LAPAROSCOPIC (IRRIGATION / IRRIGATOR) ×2 IMPLANT
SOLUTION ELECTROLUBE (MISCELLANEOUS) ×2 IMPLANT
SPONGE LAP 18X18 X RAY DECT (DISPOSABLE) IMPLANT
SUT VIC AB 0 CT1 27 (SUTURE) ×6
SUT VIC AB 0 CT1 27XBRD ANTBC (SUTURE) ×3 IMPLANT
SUT VICRYL 0 UR6 27IN ABS (SUTURE) ×2 IMPLANT
SUT VICRYL 4-0 PS2 18IN ABS (SUTURE) ×4 IMPLANT
SYR 50ML LL SCALE MARK (SYRINGE) ×2 IMPLANT
SYSTEM CONVERTIBLE TROCAR (TROCAR) IMPLANT
TIP UTERINE 5.1X6CM LAV DISP (MISCELLANEOUS) IMPLANT
TIP UTERINE 6.7X10CM GRN DISP (MISCELLANEOUS) IMPLANT
TIP UTERINE 6.7X6CM WHT DISP (MISCELLANEOUS) IMPLANT
TIP UTERINE 6.7X8CM BLUE DISP (MISCELLANEOUS) IMPLANT
TOWEL OR 17X24 6PK STRL BLUE (TOWEL DISPOSABLE) ×6 IMPLANT
TRAY FOLEY BAG SILVER LF 14FR (CATHETERS) ×2 IMPLANT
TROCAR 12M 150ML BLUNT (TROCAR) IMPLANT
TROCAR DISP BLADELESS 8 DVNC (TROCAR) ×1 IMPLANT
TROCAR DISP BLADELESS 8MM (TROCAR) ×1
TROCAR XCEL NON-BLD 5MMX100MML (ENDOMECHANICALS) ×2 IMPLANT
TROCAR Z-THREAD 12X150 (TROCAR) ×2 IMPLANT
TROCAR Z-THREAD BLADED 12X100M (TROCAR) ×1 IMPLANT
TUBING FILTER THERMOFLATOR (ELECTROSURGICAL) ×2 IMPLANT
WARMER LAPAROSCOPE (MISCELLANEOUS) ×2 IMPLANT
WATER STERILE IRR 1000ML POUR (IV SOLUTION) ×3 IMPLANT

## 2012-06-10 NOTE — Brief Op Note (Signed)
06/10/2012  5:10 PM  PATIENT:  Theresa Murray  36 y.o. female  PRE-OPERATIVE DIAGNOSIS:  Complex right ovarian cyst  POST-OPERATIVE DIAGNOSIS: Right endometrioma, Stage II pelvic endometriosis  PROCEDURE:  Procedure(s) (LRB): DAVINCI ROBOTIC ASSISTED LAPAROSCOPIC OVARIAN CYSTECTOMY (Right), EXCISION/RESECTION OF PELVIC ENDOMETRIOSIS  SURGEON:  Surgeon(s) and Role:    * Izayiah Tibbitts A Tramel Westbrook, MD - Primary  PHYSICIAN ASSISTANT:   ASSISTANTS: Marlinda Mike, CNM   ANESTHESIA:   general  EBL:  Total I/O In: 1400 [I.V.:1400] Out: -  FIINDINGS: right ovarian endometrioma, ovaries adherent to post uterus/uterosacral ligaments and pelvic sidewall, small SS fibroid, left paratubal cyst, elongated appendix, nl liver edge, omental adhesion right lat/ant abd wall  BLOOD ADMINISTERED:none  DRAINS: none   LOCAL MEDICATIONS USED:  MARCAINE     SPECIMEN:  Source of Specimen:  right ovarian cyst wall, posterior pelvic endometriosis implants  DISPOSITION OF SPECIMEN:  PATHOLOGY  COUNTS:  YES  TOURNIQUET:  * No tourniquets in log *  DICTATION: .Other Dictation: Dictation Number [id number]  PLAN OF CARE: Discharge to home after PACU  PATIENT DISPOSITION:  PACU - hemodynamically stable.   Delay start of Pharmacological VTE agent (>24hrs) due to surgical blood loss or risk of bleeding: no

## 2012-06-10 NOTE — Discharge Instructions (Addendum)
DISCHARGE INSTRUCTIONS: Laparoscopy  The following instructions have been prepared to help you care for yourself upon your return home today.  MAY TAKE IBUPROFEN/MOTRIN/ ADVIL PRODUCTS AFTER 9:00P.M. AS NEEDED FOR CRAMPS/PAIN  Wound care: Marland Kitchen Do not get the incision wet for the first 24 hours. The incision should be kept clean and dry. . The Band-Aids or dressings may be removed the day after surgery. . Should the incision become sore, red, and swollen after the first week, check with your doctor.  Personal hygiene: . Shower the day after your procedure.  Activity and limitations: . Do NOT drive or operate any equipment today. . Do NOT lift anything more than 15 pounds for 2-3 weeks after surgery. . Do NOT rest in bed all day. . Walking is encouraged. Walk each day, starting slowly with 5-minute walks 3 or 4 times a day. Slowly increase the length of your walks. . Walk up and down stairs slowly. . Do NOT do strenuous activities, such as golfing, playing tennis, bowling, running, biking, weight lifting, gardening, mowing, or vacuuming for 2-4 weeks. Ask your doctor when it is okay to start.  Diet: Eat a light meal as desired this evening. You may resume your usual diet tomorrow.  Return to work: This is dependent on the type of work you do. For the most part you can return to a desk job within a week of surgery. If you are more active at work, please discuss this with your doctor.  What to expect after your surgery: You may have a slight burning sensation when you urinate on the first day. You may have a very small amount of blood in the urine. Expect to have a small amount of vaginal discharge/light bleeding for 1-2 weeks. It is not unusual to have abdominal soreness and bruising for up to 2 weeks. You may be tired and need more rest for about 1 week. You may experience shoulder pain for 24-72 hours. Lying flat in bed may relieve it.  Call your doctor for any of the following: . Develop a  fever of 100.4 or greater . Inability to urinate 6 hours after discharge from hospital . Severe pain not relieved by pain medications . Persistent of heavy bleeding at incision site . Redness or swelling around incision site after a week . Increasing nausea or vomiting   Endometriosis Endometriosis is a disease that occurs when the endometrium (lining of the uterus) is misplaced outside of its normal location. It may occur in many locations close to the uterus (womb), but commonly on the ovaries, fallopian tubes, vagina (birth canal) and bowel located close to the uterus. Because the uterus sloughs (expels) its lining every month (menses), there is bleeding whereever the endometrial tissue is located. SYMPTOMS  Often there are no symptoms. However, because blood is irritating to tissues not normally exposed to it, when symptoms occur they vary with the location of the misplaced endometrium. Symptoms often include back and abdominal pain. Periods may be heavier and intercourse may be painful. Infertility may be present. You may have all of these symptoms at one time or another or you may have months with no symptoms at all. Although the symptoms occur mainly during menses, they can occur mid-cycle as well, and usually terminate with menopause. DIAGNOSIS  Your caregiver may recommend a blood test and urine test (urinalysis) to help rule out other conditions. Another common test is ultrasound, a painless procedure that uses sound waves to make a sonogram "picture" of abnormal  tissue that could be endometriosis. If your bowel movements are painful around your periods, your caregiver may advise a barium enema (an X-ray of the lower bowel), to try to find the source of your pain. This is sometimes confirmed by laparoscopy. Laparoscopy is a procedure where your caregiver looks into your abdomen with a laparoscope (a small pencil sized telescope). Your caregiver may take a tiny piece of tissue (biopsy) from any  abnormal tissue to confirm or document your problem. These tissues are sent to the lab and a pathologist looks at them under the microscope to give a microscopic diagnosis. TREATMENT  Once the diagnosis is made, it can be treated by destruction of the misplaced endometrial tissue using heat (diathermy), laser, cutting (excision), or chemical means. It may also be treated with hormonal therapy. When using hormonal therapy menses are eliminated, therefore eliminating the monthly exposure to blood by the misplaced endometrial tissue. Only in severe cases is it necessary to perform a hysterectomy with removal of the tubes, uterus and ovaries. HOME CARE INSTRUCTIONS   Only take over-the-counter or prescription medicines for pain, discomfort, or fever as directed by your caregiver.   Avoid activities that produce pain, including a physical sexual relationship.   Do not take aspirin as this may increase bleeding when not on hormonal therapy.   See your caregiver for pain or problems not controlled with treatment.  SEEK IMMEDIATE MEDICAL CARE IF:   Your pain is severe and is not responding to pain medicine.   You develop severe nausea and vomiting, or you cannot keep foods down.   Your pain localizes to the right lower part of your abdomen (possible appendicitis).   You have swelling or increasing pain in the abdomen.   You have a fever.   You see blood in your stool.  MAKE SURE YOU:   Understand these instructions.   Will watch your condition.   Will get help right away if you are not doing well or get worse.  Document Released: 11/30/2000 Document Revised: 11/22/2011 Document Reviewed: 07/21/2008 [facility] Patient Information 2012 [place], [place].

## 2012-06-10 NOTE — Anesthesia Preprocedure Evaluation (Signed)
Anesthesia Evaluation  Patient identified by MRN, date of birth, ID band Patient awake    Reviewed: Allergy & Precautions, H&P , NPO status , Patient's Chart, lab work & pertinent test results, reviewed documented beta blocker date and time   Airway Mallampati: I TM Distance: >3 FB Neck ROM: full    Dental No notable dental hx. (+) Teeth Intact   Pulmonary    Pulmonary exam normal       Cardiovascular negative cardio ROS      Neuro/Psych negative psych ROS   GI/Hepatic Neg liver ROS,   Endo/Other  negative endocrine ROS  Renal/GU negative Renal ROS  negative genitourinary   Musculoskeletal negative musculoskeletal ROS (+)   Abdominal Normal abdominal exam  (+)   Peds negative pediatric ROS (+)  Hematology negative hematology ROS (+)   Anesthesia Other Findings   Reproductive/Obstetrics negative OB ROS                           Anesthesia Physical Anesthesia Plan  ASA: II  Anesthesia Plan: General   Post-op Pain Management:    Induction: Intravenous  Airway Management Planned: Oral ETT  Additional Equipment:   Intra-op Plan:   Post-operative Plan: Extubation in OR  Informed Consent: I have reviewed the patients History and Physical, chart, labs and discussed the procedure including the risks, benefits and alternatives for the proposed anesthesia with the patient or authorized representative who has indicated his/her understanding and acceptance.   Dental Advisory Given  Plan Discussed with: CRNA and Surgeon  Anesthesia Plan Comments:         Anesthesia Quick Evaluation

## 2012-06-10 NOTE — Anesthesia Postprocedure Evaluation (Signed)
Anesthesia Post Note  Patient: Theresa Murray  Procedure(s) Performed: Procedure(s) (LRB): ROBOTIC ASSISTED LAPAROSCOPIC OVARIAN CYSTECTOMY (Right)  Anesthesia type: GA  Patient location: PACU  Post pain: Pain level controlled  Post assessment: Post-op Vital signs reviewed  Last Vitals:  Filed Vitals:   06/10/12 1645  BP: 129/67  Pulse: 77  Temp:   Resp: 16    Post vital signs: Reviewed  Level of consciousness: sedated  Complications: No apparent anesthesia complications

## 2012-06-10 NOTE — Transfer of Care (Signed)
Immediate Anesthesia Transfer of Care Note  Patient: Theresa Murray  Procedure(s) Performed: Procedure(s) (LRB): ROBOTIC ASSISTED LAPAROSCOPIC OVARIAN CYSTECTOMY (Right)  Patient Location: PACU  Anesthesia Type: General  Level of Consciousness: awake, alert  and oriented  Airway & Oxygen Therapy: Patient Spontanous Breathing and Patient connected to nasal cannula oxygen  Post-op Assessment: Report given to PACU RN and Post -op Vital signs reviewed and stable  Post vital signs: Reviewed and stable  Complications: No apparent anesthesia complications

## 2012-06-10 NOTE — Anesthesia Procedure Notes (Signed)
Date/Time: 06/10/2012 1:43 PM Performed by: Shanon Payor Pre-anesthesia Checklist: Patient identified, Emergency Drugs available, Suction available, Timeout performed and Patient being monitored Patient Re-evaluated:Patient Re-evaluated prior to inductionOxygen Delivery Method: Circle system utilized Preoxygenation: Pre-oxygenation with 100% oxygen Intubation Type: IV induction Ventilation: Mask ventilation without difficulty Tube type: Oral Tube size: 7.0 mm Number of attempts: 1 Airway Equipment and Method: Stylet Secured at: 22 cm Tube secured with: Tape Dental Injury: Teeth and Oropharynx as per pre-operative assessment

## 2012-06-11 NOTE — Op Note (Signed)
Theresa Murray, Theresa Murray                  ACCOUNT NO.:  [id number]  MEDICAL RECORD NO.:  [id number]  LOCATION:  [facility]                          FACILITY:  [facility]  PHYSICIAN:  Maxie Better, M.D.DATE OF BIRTH:  25-Jan-1976  DATE OF PROCEDURE:  06/10/2012 DATE OF DISCHARGE:  06/10/2012                              OPERATIVE REPORT   PREOPERATIVE DIAGNOSIS:  Complex right ovarian cyst.  POSTOPERATIVE DIAGNOSES:  Right endometrioma, stage II pelvic endometriosis.  PROCEDURES:  Da Vinci robotic right ovarian cystectomy, resection/excision of pelvic endometriosis.  ANESTHESIA:  General.  SURGEON:  Maxie Better, MD  ASSISTANT:  Marlinda Mike, CNM  PROCEDURE:  Under adequate general anesthesia, the patient was placed in the dorsal lithotomy position.  She was positioned for robotic surgery. Examination under anesthesia revealed an anteverted uterus, right adnexal fullness, no palpable mass on the left.  The patient appears to be starting her cycle.  The patient was then sterilely prepped and draped in usual fashion.  A bivalve speculum was placed in the vagina. Single-tooth tenaculum was placed on the anterior lip of the cervix and Acorn cannula was introduced in the cervical os and attached to the tenaculum for manipulation of the uterus.  The indwelling Foley catheter was sterilely placed.  Attention was then turned to the abdomen.  A 0.25% Marcaine was then injected supraumbilically and vertical incision was then made.  Veress needle was introduced.  After testing of the placement of the Veress needle, about 3 liters of CO2 was insufflated. Veress needle was then removed.  A 12-mm disposable trocar was introduced into the abdomen without incident.  A lighted robotic camera was then placed through that port.  There was no evidence of trauma into the entry of the abdomen.  Upper liver edge was noted to be normal. Gallbladder was noted to be normal.  There were some adhesions to  the right upper anterior abdominal wall.  The appendix appeared elongated. The patient was placed in Trendelenburg position subsequently and the pelvis was quickly inspected.  The uterus appeared normal with a small subserosal fibroid on the right.  The right ovary as was the left ovary was stuck behind the uterus.  Two additional robotic port sites were placed a handsbreadth away from the robotic camera port site on the right and on the left.  A 0.25% Marcaine was injected and 8 mm  Robotic ports were placed under direct visualization.  In the right lower quadrant, a 5-mm assistant port was then placed.  The robot was then brought to the patient's left side and docked.  Once this was done, the monopolar scissors was placed in the arm #1 and arm #1 was the PK dissector.  I then went to the surgical console.  At the surgical console, further inspection was notable for indeed.  There was adhesions of the left ovary to the posterior aspect of the uterus and to the pelvic sidewall.  In addition to that, there was also a attachment to a portion of the right ovary, which also contained a cyst.  The right ovary containing a cyst was adherent to the posterior aspect of the uterus and the pelvic sidewall.  Both  ureters were could not seen well.  The right tube was otherwise normal.  The left tube had a left paratubal cyst.  In the posterior cul-de-sac, there was evidence of endometriosis in the posterior cul-de-sac peritoneum.  With the procedure started by anteflexing the uterus and with blunt and sharp dissection, the ovaries  were removed from the posterior aspect of the uterus.  At which time, it was then noted particularly in the left uterosacral ligament where it enters the uterus left greater than right, contained evidence of endometriosis in adherent area of the uterus.  The ovary on the right with the cyst, additional adhesions were started to be lysed at the same time and that was being  done, incidental rupture of the cyst occurred with large amount of chocolate fluid extruding from the cyst wall.  The cyst was then further opened and once that was opened, the cyst wall was then bluntly and sharply dissected off out of the ovary. Other additional adhesions that was noted was then lysed carefully.  On the left side, the left aspect of the posterior cul-de-sac towards the left ovarian fossa, endometriosis was noted and the peritoneum was opened and dissection occurred with removal of the endometriosis on the posterior aspect of the uterus.  The uterosacral ligaments on the right and left in the posterior cul-de-sac where there was tenting from the endometriosis, the peritoneum was opened, hydrodissection was then done and then the area of endometriosis was also removed.  The left paratubal cyst was also removed.  The abdomen was irrigated and suctioned. Hemostasis was achieved with the PK dissector as well as cauterization from the monopolar scissors with care.  The posterior aspect of the uterus was inspected.  Continued irrigation and suction, the tissue and cyst wall as well as the endometriosis implants were all removed.  The cyst wall was subsequently removed by replacing the robotic camera with the 8-mm robotic camera port on the right side and removing the cyst through the umbilical site.  This was done after the robot was undocked from arm #1.  The robot was then reattached to the port site.  The robotic camera was then reinserted and continued assessment for hemostasis was done.  Once that was achieved, the appendix was then reinspected, was elongated, it was otherwise normal. The adhesions in the rightupper midquadrant of the abdomen was inspected, but left alone and the ports were then removed after Surgicel was placed in the raw surface behind the left ovarian fossa and the posterior aspect of the uterus and on the right, the ovary on the right was then  surrounded and encased with Interceed and Surgicel was placed in the deep part of the posterior cul-de-sac.  Once these were done, the robot was undocked the ports were removed, abdomen was deflated and the instruments on the vagina were subsequently removed. The incision sites were then closed with 4-0 Vicryl subcuticular stitch and the fascial stitch was placed with 0 Vicryl on the umbilicus with subcuticular 4-0 Vicryl.  Dermabond was placed over these sites.  The vagina was inspected.  No lacerations or lesions were noted.  SPECIMEN:  Right ovarian cyst wall and pelvic endometriotic peritoneal resection all sent to pathology.  ESTIMATED BLOOD LOSS:  About 50 mL.  INTRAOPERATIVE FLUIDS:  2 liters.  URINE OUTPUT:   60 mL.  Sponge and instrument counts x2 were correct.  COMPLICATIONS:  None.  The patient tolerated the procedure well, was transferred to the recovery room in stable condition.  Maxie Better, M.D.     [place]/MEDQ  D:  06/10/2012  T:  06/11/2012  Job:  [id number]

## 2012-06-12 ENCOUNTER — Encounter: Payer: Self-pay | Admitting: Internal Medicine

## 2012-06-12 DIAGNOSIS — N809 Endometriosis, unspecified: Secondary | ICD-10-CM | POA: Insufficient documentation

## 2012-09-10 ENCOUNTER — Telehealth: Payer: Self-pay | Admitting: Internal Medicine

## 2012-09-10 NOTE — Telephone Encounter (Signed)
Caller: [name]; Patient Name: Theresa Murray; PCP: Berniece Andreas [facility]); Best Callback Phone Number: [phone]; Reason for call: Back Pain in mid and lower back on the right side. Onset-Tues 9/24. No known injury. LMP-08/24/12   800 mg of Ibuprofen at 9:30 this am, did not help with symptoms. Per Back Symptoms Protocol, History of back pain and back pain not responding to usual medications/treatments, See MD within 72 hours. Home care for symptoms given. Caller declined offer of appointment, reports she is at work until 5:30 today through Sunday and can not come in to be seen. She will try home care and callback as needed.

## 2012-10-28 ENCOUNTER — Encounter: Payer: Self-pay | Admitting: Internal Medicine

## 2012-10-28 ENCOUNTER — Ambulatory Visit (INDEPENDENT_AMBULATORY_CARE_PROVIDER_SITE_OTHER): Payer: 59 | Admitting: Internal Medicine

## 2012-10-28 VITALS — BP 112/74 | HR 90 | Temp 98.5°F | Wt 211.0 lb

## 2012-10-28 DIAGNOSIS — R69 Illness, unspecified: Secondary | ICD-10-CM

## 2012-10-28 DIAGNOSIS — D649 Anemia, unspecified: Secondary | ICD-10-CM

## 2012-10-28 DIAGNOSIS — J309 Allergic rhinitis, unspecified: Secondary | ICD-10-CM

## 2012-10-28 DIAGNOSIS — R6889 Other general symptoms and signs: Secondary | ICD-10-CM | POA: Insufficient documentation

## 2012-10-28 DIAGNOSIS — N809 Endometriosis, unspecified: Secondary | ICD-10-CM

## 2012-10-28 DIAGNOSIS — R519 Headache, unspecified: Secondary | ICD-10-CM

## 2012-10-28 DIAGNOSIS — R51 Headache: Secondary | ICD-10-CM

## 2012-10-28 LAB — BASIC METABOLIC PANEL
BUN: 15 mg/dL (ref 6–23)
Chloride: 106 mEq/L (ref 96–112)
Creatinine, Ser: 0.7 mg/dL (ref 0.4–1.2)
Glucose, Bld: 71 mg/dL (ref 70–99)
Potassium: 4.4 mEq/L (ref 3.5–5.1)

## 2012-10-28 LAB — CBC WITH DIFFERENTIAL/PLATELET
Basophils Absolute: 0 10*3/uL (ref 0.0–0.1)
Eosinophils Absolute: 0.2 10*3/uL (ref 0.0–0.7)
HCT: 36.4 % (ref 36.0–46.0)
Lymphs Abs: 1.7 10*3/uL (ref 0.7–4.0)
MCV: 81.8 fl (ref 78.0–100.0)
Monocytes Absolute: 0.4 10*3/uL (ref 0.1–1.0)
Neutrophils Relative %: 52.1 % (ref 43.0–77.0)
Platelets: 268 10*3/uL (ref 150.0–400.0)
RDW: 14.1 % (ref 11.5–14.6)

## 2012-10-28 LAB — LIPID PANEL
Cholesterol: 173 mg/dL (ref 0–200)
HDL: 57.8 mg/dL (ref >39.00)
LDL Cholesterol: 96 mg/dL (ref 0–99)
Triglycerides: 95 mg/dL (ref 0.0–149.0)
VLDL: 19 mg/dL (ref 0.0–40.0)

## 2012-10-28 LAB — FERRITIN: Ferritin: 22.5 ng/mL (ref 10.0–291.0)

## 2012-10-28 LAB — HEPATIC FUNCTION PANEL: Total Bilirubin: 0.3 mg/dL (ref 0.3–1.2)

## 2012-10-28 LAB — IBC PANEL: Iron: 78 ug/dL (ref 42–145)

## 2012-10-28 LAB — VITAMIN B12: Vitamin B-12: 337 pg/mL (ref 211–911)

## 2012-10-28 MED ORDER — FLUTICASONE FUROATE 27.5 MCG/SPRAY NA SUSP: 2.0000 | Freq: Every day | NASAL | Status: DC

## 2012-10-28 NOTE — Patient Instructions (Addendum)
Uncertain why you are not  feeling well. You may have a version of migraines.  Labs  Today to r/o metabolic problem . If ok then calendar sx and headaches and we may get a headache specialist to assess you.   Aleve product is ok but can cause gastritis if taking daily. Avoid artificial sweeteners and notice triggers .   ROV in 1- 2 months

## 2012-10-28 NOTE — Progress Notes (Signed)
Chief Complaint  Patient presents with  . Headache    Dull aches.  Sometimes lasting 24 hours but mostly a few hours at a time.  . Photophobia  . Nausea    HPI: Patient comes in today for SDA for  new problem evaluation. Not well feeling and headaches .   Light sensitivities  3 x per week mild not ttrobbing then goes away and comes back hours at a time .  Neck and bifrontal .  works at [employer]   Feels unwell queasy at night   ? Eye blurring at time once second.  No diplopia .  "I like aleve "  To help  Headaches doesn't take every day .  perhaps 3 days per week.  Neg tobacco caffeine in am sleep about 7 hours  No sig etoh.  Some aat sweeteners.  ROS: See pertinent positives and negatives per HPI. Periods had lap in June. LMP OCT 31   Past Medical History  Diagnosis Date  . Anemia   . Headache   . Heart murmur     h/o murmur- no probs  . Anxiety     h/o anxiety- related to GERD- thought her heart  was "acting" up  . GERD (gastroesophageal reflux disease)     no meds  . Shortness of breath     when climbing stairs  . Endometriosis     Family History  Problem Relation Age of Onset  . Hypertension Mother   . Kidney disease Mother     tumor on kidney, removed kidney  . Hypertension Maternal Aunt   . Cancer Maternal Grandfather     lung  . Diabetes Maternal Aunt   . Lung cancer      History   Social History  . Marital Status: Single    Spouse Name: N/A    Number of Children: N/A  . Years of Education: N/A   Social History Main Topics  . Smoking status: Former Games developer  . Smokeless tobacco: None  . Alcohol Use: 1.8 oz/week    3 Glasses of wine per week     Comment: socially  . Drug Use: No  . Sexually Active:    Other Topics Concern  . 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    Outpatient Encounter Prescriptions as of  10/28/2012  Medication Sig Dispense Refill  . Ascorbic Acid (VITAMIN C) 100 MG tablet Take 100 mg by mouth daily.        . Menthol, Topical Analgesic, 5 % PADS Apply 1 patch topically as needed. For back pain      . Multiple Vitamins-Minerals (MULTIVITAMIN WITH MINERALS) tablet Take 1 tablet by mouth daily.      . fluticasone (VERAMYST) 27.5 MCG/SPRAY nasal spray Place 2 sprays into the nose daily.  10 g  5  . [DISCONTINUED] fexofenadine (ALLEGRA) 180 MG tablet Take 180 mg by mouth daily.      . [DISCONTINUED] fluticasone (VERAMYST) 27.5 MCG/SPRAY nasal spray Place 2 sprays into the nose daily.  10 g  5     EXAM:  BP 112/74  Pulse 90  Temp 98.5 F (36.9 C) (Oral)  Wt 211 lb (95.709 kg)  SpO2 99%  LMP 10/16/2012  There is no height on file to calculate BMI.  GENERAL: vitals reviewed and listed above, alert, oriented, appears  well hydrated and in no acute distress HEENT: atraumatic, conjunctiva  clear, no obvious abnormalities on inspection of external nose and ears OP : no lesion edema or exudate  NECK: no obvious masses on inspection/ palpation  No bruit  LUNGS: clear to auscultation bilaterally, no wheezes, rales or rhonchi, good air movement  CV: HRRR, no clubbing cyanosis or  peripheral edema nl cap refill  Abdomen:  Sof,t normal bowel sounds without hepatosplenomegaly, no guarding rebound or masses no CVA tenderness NEURO: oriented x 3 CN 3-12 appear intact. No focal muscle weakness or atrophy. DTRs symmetrical. Gait WNL.  Grossly non focal. No tremor or abnormal movement. Skin: normal capillary refill ,turgor , color: No acute rashes ,petechiae or bruising MS: moves all extremities without noticeable focal  abnormality PSYCH: pleasant and cooperative, no obvious depression or anxiety  ASSESSMENT AND PLAN:  Discussed the following assessment and plan:  1. Generalized headaches light sensitive  Basic metabolic panel, CBC with Differential, Hepatic function panel, Lipid  panel, TSH, T4, free, IBC panel, Ferritin, Vitamin B12   poss migraine variant calendar and may need ha referral non focal exam.  2. Feeling unwell  Basic metabolic panel, CBC with Differential, Hepatic function panel, Lipid panel, TSH, T4, free, IBC panel, Ferritin, Vitamin B12  3. Endometriosis  Basic metabolic panel, CBC with Differential, Hepatic function panel, Lipid panel, TSH, T4, free, IBC panel, Ferritin, Vitamin B12  4. Allergic rhinosinusitis  Basic metabolic panel, CBC with Differential, Hepatic function panel, Lipid panel, TSH, T4, free, IBC panel, Ferritin, Vitamin B12   refill veramyst.  5. Anemia  Basic metabolic panel, CBC with Differential, Hepatic function panel, Lipid panel, TSH, T4, free, IBC panel, Ferritin, Vitamin B12    -Patient advised to return or notify health care team  immediately if symptoms worsen or persist or new concerns arise.  Patient Instructions  Uncertain why you ar feeling well. You may have a version of migraines.  Labs  Today to r/o metabolic problem . If ok then calendar sx and headaches and we may get a headache specialist to assess you.   Aleve product is ok but can cause gastritis if taking daily. Avoid artificial sweeteners and notice triggers .   ROV in 1- 2 months   Theresa Murray. Theresa Murray M.D.

## 2012-12-06 ENCOUNTER — Emergency Department (INDEPENDENT_AMBULATORY_CARE_PROVIDER_SITE_OTHER): Payer: 59

## 2012-12-06 ENCOUNTER — Encounter (HOSPITAL_COMMUNITY): Payer: Self-pay | Admitting: *Deleted

## 2012-12-06 ENCOUNTER — Emergency Department (INDEPENDENT_AMBULATORY_CARE_PROVIDER_SITE_OTHER)
Admission: EM | Admit: 2012-12-06 | Discharge: 2012-12-06 | Disposition: A | Discharge status: Home / Self Care | Payer: 59 | Source: Home / Self Care | Attending: Emergency Medicine | Admitting: Emergency Medicine

## 2012-12-06 DIAGNOSIS — Z0289 Encounter for other administrative examinations: Secondary | ICD-10-CM

## 2012-12-06 DIAGNOSIS — M549 Dorsalgia, unspecified: Secondary | ICD-10-CM

## 2012-12-06 DIAGNOSIS — D649 Anemia, unspecified: Secondary | ICD-10-CM

## 2012-12-06 DIAGNOSIS — Z87898 Personal history of other specified conditions: Secondary | ICD-10-CM

## 2012-12-06 DIAGNOSIS — K5900 Constipation, unspecified: Secondary | ICD-10-CM

## 2012-12-06 DIAGNOSIS — R51 Headache: Secondary | ICD-10-CM

## 2012-12-06 LAB — POCT PREGNANCY, URINE: Preg Test, Ur: NEGATIVE

## 2012-12-06 LAB — POCT URINALYSIS DIP (DEVICE)
Bilirubin Urine: NEGATIVE
Glucose, UA: NEGATIVE mg/dL
Leukocytes, UA: NEGATIVE
Nitrite: NEGATIVE
pH: 5.5 (ref 5.0–8.0)

## 2012-12-06 MED ORDER — CEPHALEXIN 500 MG PO CAPS: 500.0000 mg | ORAL_CAPSULE | Freq: Three times a day (TID) | ORAL | Status: DC

## 2012-12-06 MED ORDER — METHOCARBAMOL 500 MG PO TABS: 500.0000 mg | ORAL_TABLET | Freq: Three times a day (TID) | ORAL | Status: DC

## 2012-12-06 MED ORDER — TRAMADOL HCL 50 MG PO TABS
100.0000 mg | ORAL_TABLET | Freq: Three times a day (TID) | ORAL | Status: DC | PRN
Start: 2012-12-06 — End: 2013-01-09

## 2012-12-06 MED ORDER — MELOXICAM 15 MG PO TABS: 15.0000 mg | ORAL_TABLET | Freq: Every day | ORAL | Status: DC

## 2012-12-06 NOTE — ED Notes (Signed)
Patient complains of left lower back pain x 3 days; pain on inspiration and lying on left side; nausea wednesday night that went away Thursday morning; pain radiates to hip on left side. Denies vomiting, diarrhea, fever/chills.

## 2012-12-06 NOTE — Discharge Instructions (Signed)
Do exercises twice daily followed by moist heat for 15 minutes. ° ° ° ° ° °Try to be as active as possible. ° °If no better in 2 weeks, follow up with orthopedist. ° ° °

## 2012-12-06 NOTE — ED Provider Notes (Signed)
Chief Complaint  Patient presents with  . Back Pain    History of Present Illness:   Theresa Murray is a 36 year old female who has had a three-day history of pain in her left CVA area which radiates around to the left flank of her abdomen. She denies any injury to the back. The pain is rated a 9/10 in intensity. It's worse if she bends or moves and better if she lies on her left, or sits down. The pain is sometimes worse if she takes a deep breath. It does not radiate down the leg and has been no numbness, tingling, weakness in the leg. She denies any dysuria, frequency, urgency, or hematuria. She has had no incontinence of bladder or bowel and no saddle anesthesia. She denies any anterior abdominal pain. She has had no vomiting but has felt a little bit nauseated. She denies any fever, chills, or an intended weight loss. Her last menstrual period was November 22. She did see a little bit of blood this morning. She is sexually active and is on birth control pills. She denies any prior history of back problems, kidney stones, or kidney infections.  Review of Systems:  Other than noted above, the patient denies any of the following symptoms: Systemic:  No fever, chills, severe fatigue, or unexplained weight loss. GI:  No abdominal pain, nausea, vomiting, diarrhea, constipation, incontinence of bowel, or blood in stool. GU:  No dysuria, frequency, urgency, or hematuria. No incontinence of urine or difficulty urinating.  M-S:  No neck pain, joint pain, arthritis, or myalgias. Neuro:  No paresthesias, saddle anesthesia, muscular weakness, or progressive neurological deficit.  PMFSH:  Past medical history, family history, social history, meds, and allergies were reviewed. Specifically, there is no history of cancer, major trauma, osteoporosis, immunosuppression, HIV, or IV or injection drug use.   Physical Exam:   Vital signs:  BP 119/87  Pulse 69  Temp 98.2 F (36.8 C) (Oral)  Resp 16  SpO2 98%  LMP  11/07/2012 General:  Alert, oriented, in no distress. Abdomen:  Soft, non-tender.  No organomegaly or mass.  No pulsatile midline abdominal mass or bruit. Back:  There is slight pain to palpation in the left lower lumbar area between the rib cage and iliac crest. Her back has a limited range of motion with 85 of flexion, 20 of extension, and 20 of lateral bending in each direction with pain particularly on extension and lateral bending. Straight leg raising was negative. Neuro:  Normal muscle strength, sensations and DTRs. Extremities: Pedal pulses were not felt, however feet felt warm and she had good capillary refill, there was no edema. Skin:  Clear, warm and dry.  No rash.  Labs:   Results for orders placed during the hospital encounter of 12/06/12  POCT PREGNANCY, URINE      Component Value Range   Preg Test, Ur NEGATIVE  NEGATIVE  POCT URINALYSIS DIP (DEVICE)      Component Value Range   Glucose, UA NEGATIVE  NEGATIVE mg/dL   Bilirubin Urine NEGATIVE  NEGATIVE   Ketones, ur NEGATIVE  NEGATIVE mg/dL   Specific Gravity, Urine 1.010  1.005 - 1.030   Hgb urine dipstick MODERATE (*) NEGATIVE   pH 5.5  5.0 - 8.0   Protein, ur NEGATIVE  NEGATIVE mg/dL   Urobilinogen, UA 0.2  0.0 - 1.0 mg/dL   Nitrite NEGATIVE  NEGATIVE   Leukocytes, UA NEGATIVE  NEGATIVE     Radiology:  Dg Abd 1 View  12/06/2012  *  RADIOLOGY REPORT*  Clinical Data: Left-sided flank pain, hematuria  ABDOMEN - 1 VIEW  Comparison: Lumbar spine films of 09/13/2009  Findings: There is a moderate amount of feces throughout the colon. No bowel obstruction is seen.  No opaque calculi are noted.  IMPRESSION: Moderate amount of feces in the colon.  No opaque calculi.   Original Report Authenticated By: Dwyane Dee, M.D.    Other Labs Obtained at Urgent Care Center:  A urine culture was obtained.  Results are pending at this time and we will call about any positive results.  Assessment:  The encounter diagnosis was Back  pain.  Most likely diagnosis is lumbar strain. I think the blood in the urine is probably menstrual blood in this she is just starting her menses today. Kidney stone is less likely given the normal KUB. Urinary tract infection is also a possibility. She doesn't have any definite urinary symptoms however. The patient is concerned since the holidays are coming up, so I went ahead and started cephalexin and told her to call back in 3 or 4 days to get her urine culture results. If this is negative she can stop the cephalexin. I suggested she also start the other medications and back exercises. If no better in 2 weeks return again for recheck.  Plan:   1.  The following meds were prescribed:   New Prescriptions   CEPHALEXIN (KEFLEX) 500 MG CAPSULE    Take 1 capsule (500 mg total) by mouth 3 (three) times daily.   MELOXICAM (MOBIC) 15 MG TABLET    Take 1 tablet (15 mg total) by mouth daily.   METHOCARBAMOL (ROBAXIN) 500 MG TABLET    Take 1 tablet (500 mg total) by mouth 3 (three) times daily.   TRAMADOL (ULTRAM) 50 MG TABLET    Take 2 tablets (100 mg total) by mouth every 8 (eight) hours as needed for pain.   2.  The patient was instructed in symptomatic care and handouts were given. 3.  The patient was told to return if becoming worse in any way, if no better in 2 weeks, and given some red flag symptoms that would indicate earlier return. 4.  The patient was encouraged to try to be as active as possible and given some exercises to do followed by moist heat.    Reuben Likes, MD 12/06/12 1330

## 2012-12-07 LAB — URINE CULTURE

## 2012-12-29 ENCOUNTER — Ambulatory Visit: Payer: 59 | Admitting: Internal Medicine

## 2012-12-29 DIAGNOSIS — Z0289 Encounter for other administrative examinations: Secondary | ICD-10-CM

## 2013-01-09 ENCOUNTER — Encounter: Payer: 59 | Admitting: Internal Medicine

## 2013-01-09 ENCOUNTER — Ambulatory Visit (INDEPENDENT_AMBULATORY_CARE_PROVIDER_SITE_OTHER): Payer: 59 | Admitting: Internal Medicine

## 2013-01-09 ENCOUNTER — Encounter: Payer: Self-pay | Admitting: Internal Medicine

## 2013-01-09 VITALS — BP 122/80 | HR 87 | Temp 98.0°F | Wt 214.0 lb

## 2013-01-09 DIAGNOSIS — R51 Headache: Secondary | ICD-10-CM

## 2013-01-09 DIAGNOSIS — D649 Anemia, unspecified: Secondary | ICD-10-CM

## 2013-01-09 DIAGNOSIS — Z87898 Personal history of other specified conditions: Secondary | ICD-10-CM

## 2013-01-09 DIAGNOSIS — K5900 Constipation, unspecified: Secondary | ICD-10-CM

## 2013-01-09 DIAGNOSIS — R519 Headache, unspecified: Secondary | ICD-10-CM

## 2013-01-09 LAB — POCT URINALYSIS DIP (MANUAL ENTRY)
Bilirubin, UA: NEGATIVE
Ketones, POC UA: NEGATIVE
Leukocytes, UA: NEGATIVE
pH, UA: 5.5

## 2013-01-09 MED ORDER — SUMATRIPTAN SUCCINATE 100 MG PO TABS: ORAL_TABLET | ORAL | Status: DC

## 2013-01-09 NOTE — Patient Instructions (Addendum)
Continue monitor  headaches and if  Frequent  Again then call for  Poss referral to headache specialist    this still acts like a migraine variant .    Ok to do the [place] and imitrex if needed .  Contact us if flank pain returns could have stll been a muscle pull but can reevaluate if recurrent.      Recurrent Migraine Headache A migraine headache is an intense, throbbing pain on one or both sides of your head. Recurrent migraines keep coming back. A migraine can last for 30 minutes to several hours. CAUSES  The exact cause of a migraine headache is not always known. However, a migraine may be caused when nerves in the brain become irritated and release chemicals that cause inflammation. This causes pain.  SYMPTOMS   Pain on one or both sides of your head.  Pulsating or throbbing pain.  Severe pain that prevents daily activities.  Pain that is aggravated by any physical activity.  Nausea, vomiting, or both.  Dizziness.  Pain with exposure to bright lights, loud noises, or activity.  General sensitivity to bright lights, loud noises, or smells. Before you get a migraine, you may get warning signs that a migraine is coming (aura). An aura may include:  Seeing flashing lights.  Seeing bright spots, halos, or zig-zag lines.  Having tunnel vision or blurred vision.  Having feelings of numbness or tingling.  Having trouble talking.  Having muscle weakness. MIGRAINE TRIGGERS Examples of triggers of migraine headaches include:   Alcohol.  Smoking.  Stress.  Menstruation.  Aged cheeses.  Foods or drinks that contain nitrates, glutamate, aspartame, or tyramine.  Lack of sleep.  Chocolate.  Caffeine.  Hunger.  Physical exertion.  Fatigue.  Medicines used to treat chest pain (nitroglycerine), birth control pills, estrogen, and some blood pressure medicines. DIAGNOSIS  A recurrent migraine headache is often diagnosed based on:  Symptoms.  Physical  examination.  A CT scan or MRI of your head. TREATMENT  Medicines may be given for pain and nausea. Medicines can also be given to help prevent recurrent migraines. HOME CARE INSTRUCTIONS  Only take over-the-counter or prescription medicines for pain or discomfort as directed by your caregiver. The use of long-term narcotics is not recommended.  Lie down in a dark, quiet room when you have a migraine.  Keep a journal to find out what may trigger your migraine headaches. For example, write down:  What you eat and drink.  How much sleep you get.  Any change to your diet or medicines.  Limit alcohol consumption.  Quit smoking if you smoke.  Get 7 to 9 hours of sleep, or as recommended by your caregiver.  Limit stress.  Keep lights dim if bright lights bother you and make your migraines worse. SEEK MEDICAL CARE IF:   You do not get relief from the medicines given to you.  You have a recurrence of pain. SEEK IMMEDIATE MEDICAL CARE IF:  Your migraine becomes severe.  You have a fever.  You have a stiff neck.  You have loss of vision.  You have muscular weakness or loss of muscle control.  You start losing your balance or have trouble walking.  You feel faint or pass out.  You have severe symptoms that are different from your first symptoms. MAKE SURE YOU:   Understand these instructions.  Will watch your condition.  Will get help right away if you are not doing well or get worse. Document Released:  08/28/2001 Document Revised: 02/25/2012 Document Reviewed: 11/23/2011 [facility] Patient Information 2013 [place], [place].    Sumatriptan tablets What is this medicine? SUMATRIPTAN (soo ma TRIP tan) is used to treat migraines with or without aura. An aura is a strange feeling or visual disturbance that warns you of an attack. It is not used to prevent migraines. This medicine may be used for other purposes; ask your health care provider or pharmacist if you have  questions. What should I tell my health care provider before I take this medicine? They need to know if you have any of these conditions: -bowel disease or colitis -diabetes -family history of heart disease -fast or irregular heart beat -heart or blood vessel disease, angina (chest pain), or previous heart attack -high blood pressure -high cholesterol -history of stroke, transient ischemic attacks (TIAs or mini-strokes), or intracranial bleeding -kidney or liver disease -overweight -poor circulation -postmenopausal or surgical removal of uterus and ovaries -Raynaud's disease -seizure disorder -an unusual or allergic reaction to sumatriptan, other medicines, foods, dyes, or preservatives -pregnant or trying to get pregnant -breast-feeding How should I use this medicine? Take this medicine by mouth with a glass of water. Follow the directions on the prescription label. This medicine is taken at the first symptoms of a migraine. It is not for everyday use. If your migraine headache returns after one dose, you can take another dose as directed. You must leave at least 2 hours between doses, and do not take more than 100 mg as a single dose. Do not take more than 200 mg total in any 24 hour period. If there is no improvement at all after the first dose, do not take a second dose without talking to your doctor or health care professional. Do not take your medicine more often than directed. Talk to your pediatrician regarding the use of this medicine in children. Special care may be needed. Overdosage: If you think you have taken too much of this medicine contact a poison control center or emergency room at once. NOTE: This medicine is only for you. Do not share this medicine with others. What if I miss a dose? This does not apply; this medicine is not for regular use. What may interact with this medicine? Do not take this medicine with any of the following medicines: -amphetamine or  cocaine -dihydroergotamine, ergotamine, ergoloid mesylates, methysergide, or ergot-type medication - do not take within 24 hours of taking sumatriptan -feverfew -MAOIs like Carbex, Eldepryl, Marplan, Nardil, and Parnate - do not take sumatriptan within 2 weeks of stopping MAOI therapy -other migraine medicines like almotriptan, eletriptan, naratriptan, rizatriptan, zolmitriptan - do not take within 24 hours of taking sumatriptan -tryptophan This medicine may also interact with the following medications: -lithium -medicines for mental depression, anxiety or mood problems -medicines for weight loss such as dexfenfluramine, dextroamphetamine, fenfluramine, or sibutramine -St. John's wort This list may not describe all possible interactions. Give your health care provider a list of all the medicines, herbs, non-prescription drugs, or dietary supplements you use. Also tell them if you smoke, drink alcohol, or use illegal drugs. Some items may interact with your medicine. What should I watch for while using this medicine? Only take this medicine for a migraine headache. Take it if you get warning symptoms or at the start of a migraine attack. It is not for regular use to prevent migraine attacks. You may get drowsy or dizzy. Do not drive, use machinery, or do anything that needs mental alertness until you know  how this medicine affects you. To reduce dizzy or fainting spells, do not sit or stand up quickly, especially if you are an older patient. Alcohol can increase drowsiness, dizziness and flushing. Avoid alcoholic drinks. Smoking cigarettes may increase the risk of heart-related side effects from using this medicine. What side effects may I notice from receiving this medicine? Side effects that you should report to your doctor or health care professional as soon as possible: -allergic reactions like skin rash, itching or hives, swelling of the face, lips, or tongue -breathing problems -changes in  vision -chest or throat pain, tightness -fast, slow, or irregular heart beat -hallucinations -increased or decreased blood pressure -problems with balance, talking, walking -seizures -severe stomach pain and cramping, bloody diarrhea -tingling, pain, or numbness in the face, hands or feet Side effects that usually do not require medical attention (report to your doctor or health care professional if they continue or are bothersome): -drowsiness -feeling warm, flushing, or redness of the face -muscle pain or cramps -nausea, vomiting, diarrhea or stomach upset -weak or tired This list may not describe all possible side effects. Call your doctor for medical advice about side effects. You may report side effects to FDA at 1-800-FDA-1088. Where should I keep my medicine? Keep out of the reach of children. Store at room temperature between 2 and 30 degrees C (36 and 86 degrees F). Throw away any unused medicine after the expiration date. NOTE: This sheet is a summary. It may not cover all possible information. If you have questions about this medicine, talk to your doctor, pharmacist, or health care provider.  2013, [employer]vier/[employer] Standard. (02/09/2008 6:27:14 PM)

## 2013-01-09 NOTE — Progress Notes (Signed)
Chief Complaint  Patient presents with  . Follow-up    Seen at the urgent care center on Dec. 19 for back pain.  Found a blockage in her intestines.  Needs further instructions on what to do.    HPI: Comes in for follow up of  multiple medical issues Mostly HA s but was seen  In euc for left flank pain felt to be from constipation    Has mild this am  Took aleve .  Only one this week.  At work came on and had throbbing  Ha in afternoon an throbbing with aleve.   An one on November  Light is sensitive.  Seem to be less frquent only trigger known is lights   Flank pain never had before did radiate to abd  ? Muscle pull and went away in a few day s slowly treated as uti and or MS cause.   No hx of stone  In her or her family .  Ok now here for FU  ROS: See pertinent positives and negatives per HPI. No hematuria  NVD blood in stool   no fam hx of renal stones and now.   Past Medical History  Diagnosis Date  . Anemia   . Headache   . Heart murmur     h/o murmur- no probs  . Anxiety     h/o anxiety- related to GERD- thought her heart  was "acting" up  . GERD (gastroesophageal reflux disease)     no meds  . Shortness of breath     when climbing stairs  . Endometriosis     Family History  Problem Relation Age of Onset  . Hypertension Mother   . Kidney disease Mother     tumor on kidney, removed kidney  . Hypertension Maternal Aunt   . Cancer Maternal Grandfather     lung  . Diabetes Maternal Aunt   . Lung cancer      History   Social History  . Marital Status: Single    Spouse Name: N/A    Number of Children: N/A  . Years of Education: N/A   Social History Main Topics  . Smoking status: Former Games developer  . Smokeless tobacco: None  . Alcohol Use: 1.8 oz/week    3 Glasses of wine per week     Comment: socially  . Drug Use: No  . Sexually Active:    Other Topics Concern  . 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    Outpatient Encounter Prescriptions as of 01/09/2013  Medication Sig Dispense Refill  . Ascorbic Acid (VITAMIN C) 100 MG tablet Take 100 mg by mouth daily.        . fluticasone (VERAMYST) 27.5 MCG/SPRAY nasal spray Place 2 sprays into the nose daily.  10 g  5  . Menthol, Topical Analgesic, 5 % PADS Apply 1 patch topically as needed. For back pain      . Multiple Vitamins-Minerals (MULTIVITAMIN WITH MINERALS) tablet Take 1 tablet by mouth daily.      . SUMAtriptan (IMITREX) 100 MG tablet Take 1 at onset of head ache and can repeat in  2 hours if needed.  6 tablet  1  . [DISCONTINUED] cephALEXin (KEFLEX) 500 MG capsule Take 1 capsule (500 mg total) by mouth 3 (three) times daily.  30 capsule  0  . [  DISCONTINUED] meloxicam (MOBIC) 15 MG tablet Take 1 tablet (15 mg total) by mouth daily.  15 tablet  0  . [DISCONTINUED] methocarbamol (ROBAXIN) 500 MG tablet Take 1 tablet (500 mg total) by mouth 3 (three) times daily.  30 tablet  0  . [DISCONTINUED] traMADol (ULTRAM) 50 MG tablet Take 2 tablets (100 mg total) by mouth every 8 (eight) hours as needed for pain.  30 tablet  0    EXAM:  BP 122/80  Pulse 87  Temp 98 F (36.7 C) (Oral)  Wt 214 lb (97.07 kg)  SpO2 99%  LMP 01/01/2013  There is no height on file to calculate BMI.  GENERAL: vitals reviewed and listed above, alert, oriented, appears well hydrated and in no acute distress  HEENT: atraumatic, conjunctiva  clear, no obvious abnormalities on inspection of external nose and ears NECK: no obvious masses on inspection palpation  LUNGS: clear to auscultation bilaterally, no wheezes, rales or rhonchi, good air movement CV: HRRR, no clubbing cyanosis or  peripheral edema nl cap refill  Abdomen:  Sof,t normal bowel sounds without hepatosplenomegaly, no guarding rebound or masses no CVA tenderness MS: moves all extremities without noticeable focal   Abnormality Neuro grossly normal   PSYCH: pleasant and cooperative, no obvious depression or anxiety UA normal  Reviewed labs with patient Lab Results  Component Value Date   WBC 4.9 10/28/2012   HGB 11.5* 10/28/2012   HCT 36.4 10/28/2012   PLT 268.0 10/28/2012   GLUCOSE 71 10/28/2012   CHOL 173 10/28/2012   TRIG 95.0 10/28/2012   HDL 57.80 10/28/2012   LDLCALC 96 10/28/2012   ALT 17 10/28/2012   AST 16 10/28/2012   NA 139 10/28/2012   K 4.4 10/28/2012   CL 106 10/28/2012   CREATININE 0.7 10/28/2012   BUN 15 10/28/2012   CO2 28 10/28/2012   TSH 0.35 10/28/2012   ASSESSMENT AND PLAN:  Discussed the following assessment and plan:  1. Generalized headaches recurrent     recurrent seem like migraine will refer if more pronounced can try imitrex if wihses as needed and fu   2. H/O left flank pain  POCT urinalysis dipstick   ? cause recheck if recurs  consider check for stone    3. Mild anemia     hx of same   4. Constipation     by kub increase foods and miralx use if needed  no alarm features      -Patient advised to return or notify health care team  immediately if symptoms worsen or persist or new concerns arise.  Patient Instructions  Continue monitor  headaches and if  Frequent  Again then call for  Poss referral to headache specialist    this still acts like a migraine variant .    Ok to do the [place] and imitrex if needed .  Contact us if flank pain returns could have stll been a muscle pull but can reevaluate if recurrent.      Recurrent Migraine Headache A migraine headache is an intense, throbbing pain on one or both sides of your head. Recurrent migraines keep coming back. A migraine can last for 30 minutes to several hours. CAUSES  The exact cause of a migraine headache is not always known. However, a migraine may be caused when nerves in the brain become irritated and release chemicals that cause inflammation. This causes pain.  SYMPTOMS   Pain on one  or both sides of your head.  Pulsating  or throbbing pain.  Severe pain that prevents daily activities.  Pain that is aggravated by any physical activity.  Nausea, vomiting, or both.  Dizziness.  Pain with exposure to bright lights, loud noises, or activity.  General sensitivity to bright lights, loud noises, or smells. Before you get a migraine, you may get warning signs that a migraine is coming (aura). An aura may include:  Seeing flashing lights.  Seeing bright spots, halos, or zig-zag lines.  Having tunnel vision or blurred vision.  Having feelings of numbness or tingling.  Having trouble talking.  Having muscle weakness. MIGRAINE TRIGGERS Examples of triggers of migraine headaches include:   Alcohol.  Smoking.  Stress.  Menstruation.  Aged cheeses.  Foods or drinks that contain nitrates, glutamate, aspartame, or tyramine.  Lack of sleep.  Chocolate.  Caffeine.  Hunger.  Physical exertion.  Fatigue.  Medicines used to treat chest pain (nitroglycerine), birth control pills, estrogen, and some blood pressure medicines. DIAGNOSIS  A recurrent migraine headache is often diagnosed based on:  Symptoms.  Physical examination.  A CT scan or MRI of your head. TREATMENT  Medicines may be given for pain and nausea. Medicines can also be given to help prevent recurrent migraines. HOME CARE INSTRUCTIONS  Only take over-the-counter or prescription medicines for pain or discomfort as directed by your caregiver. The use of long-term narcotics is not recommended.  Lie down in a dark, quiet room when you have a migraine.  Keep a journal to find out what may trigger your migraine headaches. For example, write down:  What you eat and drink.  How much sleep you get.  Any change to your diet or medicines.  Limit alcohol consumption.  Quit smoking if you smoke.  Get 7 to 9 hours of sleep, or as recommended by your caregiver.  Limit stress.  Keep  lights dim if bright lights bother you and make your migraines worse. SEEK MEDICAL CARE IF:   You do not get relief from the medicines given to you.  You have a recurrence of pain. SEEK IMMEDIATE MEDICAL CARE IF:  Your migraine becomes severe.  You have a fever.  You have a stiff neck.  You have loss of vision.  You have muscular weakness or loss of muscle control.  You start losing your balance or have trouble walking.  You feel faint or pass out.  You have severe symptoms that are different from your first symptoms. MAKE SURE YOU:   Understand these instructions.  Will watch your condition.  Will get help right away if you are not doing well or get worse. Document Released: 08/28/2001 Document Revised: 02/25/2012 Document Reviewed: 11/23/2011 [facility] Patient Information 2013 [place], [place].    Sumatriptan tablets What is this medicine? SUMATRIPTAN (soo ma TRIP tan) is used to treat migraines with or without aura. An aura is a strange feeling or visual disturbance that warns you of an attack. It is not used to prevent migraines. This medicine may be used for other purposes; ask your health care provider or pharmacist if you have questions. What should I tell my health care provider before I take this medicine? They need to know if you have any of these conditions: -bowel disease or colitis -diabetes -family history of heart disease -fast or irregular heart beat -heart or blood vessel disease, angina (chest pain), or previous heart attack -high blood pressure -high cholesterol -history of stroke, transient ischemic attacks (TIAs or mini-strokes), or intracranial bleeding -kidney or liver disease -overweight -poor  circulation -postmenopausal or surgical removal of uterus and ovaries -Raynaud's disease -seizure disorder -an unusual or allergic reaction to sumatriptan, other medicines, foods, dyes, or preservatives -pregnant or trying to get  pregnant -breast-feeding How should I use this medicine? Take this medicine by mouth with a glass of water. Follow the directions on the prescription label. This medicine is taken at the first symptoms of a migraine. It is not for everyday use. If your migraine headache returns after one dose, you can take another dose as directed. You must leave at least 2 hours between doses, and do not take more than 100 mg as a single dose. Do not take more than 200 mg total in any 24 hour period. If there is no improvement at all after the first dose, do not take a second dose without talking to your doctor or health care professional. Do not take your medicine more often than directed. Talk to your pediatrician regarding the use of this medicine in children. Special care may be needed. Overdosage: If you think you have taken too much of this medicine contact a poison control center or emergency room at once. NOTE: This medicine is only for you. Do not share this medicine with others. What if I miss a dose? This does not apply; this medicine is not for regular use. What may interact with this medicine? Do not take this medicine with any of the following medicines: -amphetamine or cocaine -dihydroergotamine, ergotamine, ergoloid mesylates, methysergide, or ergot-type medication - do not take within 24 hours of taking sumatriptan -feverfew -MAOIs like Carbex, Eldepryl, Marplan, Nardil, and Parnate - do not take sumatriptan within 2 weeks of stopping MAOI therapy -other migraine medicines like almotriptan, eletriptan, naratriptan, rizatriptan, zolmitriptan - do not take within 24 hours of taking sumatriptan -tryptophan This medicine may also interact with the following medications: -lithium -medicines for mental depression, anxiety or mood problems -medicines for weight loss such as dexfenfluramine, dextroamphetamine, fenfluramine, or sibutramine -St. John's wort This list may not describe all possible  interactions. Give your health care provider a list of all the medicines, herbs, non-prescription drugs, or dietary supplements you use. Also tell them if you smoke, drink alcohol, or use illegal drugs. Some items may interact with your medicine. What should I watch for while using this medicine? Only take this medicine for a migraine headache. Take it if you get warning symptoms or at the start of a migraine attack. It is not for regular use to prevent migraine attacks. You may get drowsy or dizzy. Do not drive, use machinery, or do anything that needs mental alertness until you know how this medicine affects you. To reduce dizzy or fainting spells, do not sit or stand up quickly, especially if you are an older patient. Alcohol can increase drowsiness, dizziness and flushing. Avoid alcoholic drinks. Smoking cigarettes may increase the risk of heart-related side effects from using this medicine. What side effects may I notice from receiving this medicine? Side effects that you should report to your doctor or health care professional as soon as possible: -allergic reactions like skin rash, itching or hives, swelling of the face, lips, or tongue -breathing problems -changes in vision -chest or throat pain, tightness -fast, slow, or irregular heart beat -hallucinations -increased or decreased blood pressure -problems with balance, talking, walking -seizures -severe stomach pain and cramping, bloody diarrhea -tingling, pain, or numbness in the face, hands or feet Side effects that usually do not require medical attention (report to your doctor or health  care professional if they continue or are bothersome): -drowsiness -feeling warm, flushing, or redness of the face -muscle pain or cramps -nausea, vomiting, diarrhea or stomach upset -weak or tired This list may not describe all possible side effects. Call your doctor for medical advice about side effects. You may report side effects to FDA at  1-800-FDA-1088. Where should I keep my medicine? Keep out of the reach of children. Store at room temperature between 2 and 30 degrees C (36 and 86 degrees F). Throw away any unused medicine after the expiration date. NOTE: This sheet is a summary. It may not cover all possible information. If you have questions about this medicine, talk to your doctor, pharmacist, or health care provider.  2013, [employer]. (02/09/2008 6:27:14 PM)   Neta Mends. Tarry Blayney M.D.

## 2013-01-09 NOTE — Progress Notes (Signed)
Opened erroneously   Has 2 records opened for this day see other  Document.

## 2013-04-20 ENCOUNTER — Encounter: Payer: Self-pay | Admitting: Internal Medicine

## 2013-04-20 ENCOUNTER — Ambulatory Visit (INDEPENDENT_AMBULATORY_CARE_PROVIDER_SITE_OTHER): Payer: 59 | Admitting: Internal Medicine

## 2013-04-20 VITALS — BP 118/74 | HR 82 | Temp 98.5°F | Wt 214.0 lb

## 2013-04-20 DIAGNOSIS — J309 Allergic rhinitis, unspecified: Secondary | ICD-10-CM

## 2013-04-20 DIAGNOSIS — J302 Other seasonal allergic rhinitis: Secondary | ICD-10-CM

## 2013-04-20 DIAGNOSIS — R05 Cough: Secondary | ICD-10-CM

## 2013-04-20 DIAGNOSIS — R059 Cough, unspecified: Secondary | ICD-10-CM

## 2013-04-20 MED ORDER — HYDROCODONE-HOMATROPINE 5-1.5 MG/5ML PO SYRP: 5.0000 mL | ORAL_SOLUTION | ORAL | Status: DC | PRN

## 2013-04-20 MED ORDER — PREDNISONE 20 MG PO TABS: ORAL_TABLET | ORAL | Status: DC

## 2013-04-20 MED ORDER — ALBUTEROL SULFATE (5 MG/ML) 0.5% IN NEBU
2.5000 mg | INHALATION_SOLUTION | Freq: Once | RESPIRATORY_TRACT | Status: AC
  Administered 2013-04-20: 2.5 mg via RESPIRATORY_TRACT

## 2013-04-20 NOTE — Patient Instructions (Addendum)
chest exam is clear of pneumonia infection sounds.  However because of the itchy throat stuffy nose and severity of your cough I would consider the cause as a allergic cough.  Sometimes these get better with prednisone which is a strong anti-inflammatory that does not fight infection but decrease his inflammation from allergy.  Sometimes inhaler that we use for asthma is also helpful.  In case there is reflux in addition irritating her vocal cords and making her cough and throat clear add Prilosec one a day for 2 weeks Cough med as needed Restart the nasal cortisone on a daily basis to decrease allergic nose congestion  If this is persistent and progressive get a fever short of breath contact us for reevaluation.     Cough, Adult  A cough is a reflex that helps clear your throat and airways. It can help heal the body or may be a reaction to an irritated airway. A cough may only last 2 or 3 weeks (acute) or may last more than 8 weeks (chronic).  CAUSES Acute cough:  Viral or bacterial infections. Chronic cough:  Infections.  Allergies.  Asthma.  Post-nasal drip.  Smoking.  Heartburn or acid reflux.  Some medicines.  Chronic lung problems (COPD).  Cancer. SYMPTOMS   Cough.  Fever.  Chest pain.  Increased breathing rate.  High-pitched whistling sound when breathing (wheezing).  Colored mucus that you cough up (sputum). TREATMENT   A bacterial cough may be treated with antibiotic medicine.  A viral cough must run its course and will not respond to antibiotics.  Your caregiver may recommend other treatments if you have a chronic cough. HOME CARE INSTRUCTIONS   Only take over-the-counter or prescription medicines for pain, discomfort, or fever as directed by your caregiver. Use cough suppressants only as directed by your caregiver.  Use a cold steam vaporizer or humidifier in your bedroom or home to help loosen secretions.  Sleep in a semi-upright  position if your cough is worse at night.  Rest as needed.  Stop smoking if you smoke. SEEK IMMEDIATE MEDICAL CARE IF:   You have pus in your sputum.  Your cough starts to worsen.  You cannot control your cough with suppressants and are losing sleep.  You begin coughing up blood.  You have difficulty breathing.  You develop pain which is getting worse or is uncontrolled with medicine.  You have a fever. MAKE SURE YOU:   Understand these instructions.  Will watch your condition.  Will get help right away if you are not doing well or get worse. Document Released: 06/01/2011 Document Revised: 02/25/2012 Document Reviewed: 06/01/2011 [facility] Patient Information 2013 [place], [place].

## 2013-04-20 NOTE — Progress Notes (Signed)
Chief Complaint  Patient presents with  . Cough    Started last Friday.  Has tried Mucinex, Robitussin and Hydrocodone cough syrup.    HPI: Patient comes in today for an acute visit. Onset of cough about a week ago after she states boyfriend turned on standing she woke up with some congestion throat itching. She has nasal stuffiness has been taking Zyrtec all week but her cough is very persistent severe and dry.  She tried the hydrocodone left over from last year and it didn't work. He has no true nasal itching but feels like she needs to rub it consists irritated.  No infected phlegm or hemoptysis.  Coughing is bad at night and she feels hoarse no smoking or exposure to tobacco smoke at this time ROS: See pertinent positives and negatives per HPI. No syncope fever chills  Past Medical History  Diagnosis Date  . Anemia   . Headache   . Heart murmur     h/o murmur- no probs  . Anxiety     h/o anxiety- related to GERD- thought her heart  was "acting" up  . GERD (gastroesophageal reflux disease)     no meds  . Shortness of breath     when climbing stairs  . Endometriosis     Family History  Problem Relation Age of Onset  . Hypertension Mother   . Kidney disease Mother     tumor on kidney, removed kidney  . Hypertension Maternal Aunt   . Cancer Maternal Grandfather     lung  . Diabetes Maternal Aunt   . Lung cancer      History   Social History  . Marital Status: Single    Spouse Name: N/A    Number of Children: N/A  . Years of Education: N/A   Social History Main Topics  . Smoking status: Former Games developer  . Smokeless tobacco: None  . Alcohol Use: 1.8 oz/week    3 Glasses of wine per week     Comment: socially  . Drug Use: No  . Sexually Active:    Other Topics Concern  . 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                Outpatient Encounter Prescriptions as of 04/20/2013  Medication Sig Dispense Refill  . Ascorbic Acid (VITAMIN C) 100 MG tablet Take 100 mg by mouth daily.        . fluticasone (VERAMYST) 27.5 MCG/SPRAY nasal spray Place 2 sprays into the nose daily.  10 g  5  . Menthol, Topical Analgesic, 5 % PADS Apply 1 patch topically as needed. For back pain      . Multiple Vitamins-Minerals (MULTIVITAMIN WITH MINERALS) tablet Take 1 tablet by mouth daily.      . SUMAtriptan (IMITREX) 100 MG tablet Take 1 at onset of head ache and can repeat in  2 hours if needed.  6 tablet  1  . HYDROcodone-homatropine (HYCODAN) 5-1.5 MG/5ML syrup Take 5 mLs by mouth every 4 (four) hours as needed for cough.  180 mL  0  . predniSONE (DELTASONE) 20 MG tablet Take 3 po qd for 2 days then 2 po qd for 3 days,or as directed  12 tablet  0  . [DISCONTINUED] HYDROcodone-homatropine (HYCODAN) 5-1.5 MG/5ML syrup Take 5 mLs by mouth every 4 (four) hours as needed for cough.  180 mL  0  . [EXPIRED] albuterol (PROVENTIL) (5 MG/ML) 0.5% nebulizer solution 2.5 mg        No facility-administered encounter medications on file as of 04/20/2013.    EXAM:  BP 118/74  Pulse 82  Temp(Src) 98.5 F (36.9 C) (Oral)  Wt 214 lb (97.07 kg)  BMI 34.56 kg/m2  SpO2 98%  LMP 04/17/2013  Body mass index is 34.56 kg/(m^2).  GENERAL: vitals reviewed and listed above, alert, oriented, appears well hydrated and in no acute distress she is mildly hoarse in no respiratory distress does a lot of throat clearing during the exam. Her nose is somewhat congested  HEENT: atraumatic, conjunctiva  clear, no obvious abnormalities on inspection of external nose and ears nose congested no discharge face nontender OP : no lesion edema or exudate   NECK: no obvious masses on inspection palpation no adenopathy  LUNGS: clear to auscultation bilaterally, no wheezes, rales or rhonchi, uncertain of air movement. Seems a bit tight after  nebulizer she states she doesn't feel that much better but her air movement is better no wheezes  CV: HRRR, no clubbing cyanosis or  peripheral edema nl cap refill   MS: moves all extremities without noticeable focal  abnormality  PSYCH: pleasant and cooperative, no obvious depression or anxiety  ASSESSMENT AND PLAN:  Discussed the following assessment and plan:  Cough - Plan: albuterol (PROVENTIL) (5 MG/ML) 0.5% nebulizer solution 2.5 mg  Allergic rhinitis, seasonal Spasmatic coughs it sounds allergic. Especially with her history of tickle and itching in her throat. Would treat as an allergic cough at this time risk-benefit of medications discussed close followup appropriate if persistent or progressive. Expectant management -Patient advised to return or notify health care team  if symptoms worsen or persist or new concerns arise.  Patient Instructions  chest exam is clear of pneumonia infection sounds.  However because of the itchy throat stuffy nose and severity of your cough I would consider the cause as a allergic cough.  Sometimes these get better with prednisone which is a strong anti-inflammatory that does not fight infection but decrease his inflammation from allergy.  Sometimes inhaler that we use for asthma is also helpful.  In case there is reflux in addition irritating her vocal cords and making her cough and throat clear add Prilosec one a day for 2 weeks Cough med as needed Restart the nasal cortisone on a daily basis to decrease allergic nose congestion  If this is persistent and progressive get a fever short of breath contact us for reevaluation.     Cough, Adult  A cough is a reflex that helps clear your throat and airways. It can help heal the body or may be a reaction to an irritated airway. A cough may only last 2 or 3 weeks (acute) or may last more than 8 weeks (chronic).  CAUSES Acute cough:  Viral or bacterial infections. Chronic  cough:  Infections.  Allergies.  Asthma.  Post-nasal drip.  Smoking.  Heartburn or acid reflux.  Some medicines.  Chronic lung problems (COPD).  Cancer. SYMPTOMS   Cough.  Fever.  Chest pain.  Increased breathing rate.  High-pitched whistling sound when breathing (wheezing).  Colored mucus that you cough up (sputum). TREATMENT   A bacterial cough may be treated with antibiotic medicine.  A viral cough must run  its course and will not respond to antibiotics.  Your caregiver may recommend other treatments if you have a chronic cough. HOME CARE INSTRUCTIONS   Only take over-the-counter or prescription medicines for pain, discomfort, or fever as directed by your caregiver. Use cough suppressants only as directed by your caregiver.  Use a cold steam vaporizer or humidifier in your bedroom or home to help loosen secretions.  Sleep in a semi-upright position if your cough is worse at night.  Rest as needed.  Stop smoking if you smoke. SEEK IMMEDIATE MEDICAL CARE IF:   You have pus in your sputum.  Your cough starts to worsen.  You cannot control your cough with suppressants and are losing sleep.  You begin coughing up blood.  You have difficulty breathing.  You develop pain which is getting worse or is uncontrolled with medicine.  You have a fever. MAKE SURE YOU:   Understand these instructions.  Will watch your condition.  Will get help right away if you are not doing well or get worse. Document Released: 06/01/2011 Document Revised: 02/25/2012 Document Reviewed: 06/01/2011 [facility] Patient Information 2013 [place], [place].      Neta Mends. Reginia Battie M.D.

## 2013-04-21 DIAGNOSIS — R059 Cough, unspecified: Secondary | ICD-10-CM | POA: Insufficient documentation

## 2013-04-21 DIAGNOSIS — J302 Other seasonal allergic rhinitis: Secondary | ICD-10-CM | POA: Insufficient documentation

## 2013-04-21 DIAGNOSIS — R05 Cough: Secondary | ICD-10-CM | POA: Insufficient documentation

## 2013-05-07 ENCOUNTER — Encounter: Payer: Self-pay | Admitting: Family Medicine

## 2013-05-07 ENCOUNTER — Ambulatory Visit (INDEPENDENT_AMBULATORY_CARE_PROVIDER_SITE_OTHER): Payer: 59 | Admitting: Family Medicine

## 2013-05-07 VITALS — BP 110/70 | HR 68 | Temp 98.4°F | Wt 213.0 lb

## 2013-05-07 DIAGNOSIS — J309 Allergic rhinitis, unspecified: Secondary | ICD-10-CM

## 2013-05-07 NOTE — Patient Instructions (Signed)
-  take allegra and nasal steroid every day

## 2013-05-07 NOTE — Progress Notes (Signed)
Chief Complaint  Patient presents with  . Sore Throat    HPI:  Acute visit for sore throat: -chronic allergies - has not been taking allergy medication -symptoms: nasal congestion, sore throat, sneezing, watery itchy eyes -when took prednisone recent felt weird while on it - L shoulder pain, anxiety, hypochondriac - worse when on med, heart skipped a beat a few times - resolved now -denies: fevers, SOB, NVD   ROS: See pertinent positives and negatives per HPI.  Past Medical History  Diagnosis Date  . Anemia   . Headache   . Heart murmur     h/o murmur- no probs  . Anxiety     h/o anxiety- related to GERD- thought her heart  was "acting" up  . GERD (gastroesophageal reflux disease)     no meds  . Shortness of breath     when climbing stairs  . Endometriosis     Family History  Problem Relation Age of Onset  . Hypertension Mother   . Kidney disease Mother     tumor on kidney, removed kidney  . Hypertension Maternal Aunt   . Cancer Maternal Grandfather     lung  . Diabetes Maternal Aunt   . Lung cancer      History   Social History  . Marital Status: Single    Spouse Name: N/A    Number of Children: N/A  . Years of Education: N/A   Social History Main Topics  . Smoking status: Former Games developer  . Smokeless tobacco: None  . Alcohol Use: 1.8 oz/week    3 Glasses of wine per week     Comment: socially  . Drug Use: No  . Sexually Active:    Other Topics Concern  . 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                Current outpatient prescriptions:Ascorbic Acid (VITAMIN C) 100 MG tablet, Take 100 mg by mouth daily.  , Disp: , Rfl: ;  fluticasone (VERAMYST) 27.5 MCG/SPRAY nasal spray, Place 2 sprays into the nose daily., Disp: 10 g, Rfl: 5;  Menthol, Topical Analgesic, 5 % PADS, Apply 1 patch  topically as needed. For back pain, Disp: , Rfl: ;  Multiple Vitamins-Minerals (MULTIVITAMIN WITH MINERALS) tablet, Take 1 tablet by mouth daily., Disp: , Rfl:  SUMAtriptan (IMITREX) 100 MG tablet, Take 1 at onset of head ache and can repeat in  2 hours if needed., Disp: 6 tablet, Rfl: 1  EXAM:  Filed Vitals:   05/07/13 1044  BP: 110/70  Pulse: 68  Temp: 98.4 F (36.9 C)    Body mass index is 34.4 kg/(m^2).  GENERAL: vitals reviewed and listed above, alert, oriented, appears well hydrated and in no acute distress  HEENT: atraumatic, conjunttiva clear, no obvious abnormalities on inspection of external nose and ears, normal appearance of ear canals and TMs, clear nasal congestion, mild post oropharyngeal erythema with PND, no tonsillar edema or exudate, no sinus TTP  NECK: no obvious masses on inspection  LUNGS: clear to auscultation bilaterally, no wheezes, rales or rhonchi, good air movement  CV: HRRR, no peripheral edema  MS: moves all extremities without noticeable abnormality  PSYCH: pleasant and cooperative, no  obvious depression or anxiety  ASSESSMENT AND PLAN:  Discussed the following assessment and plan:  Allergic rhinitis  -advised to take antihistamine and nasal steroid every day -follow up with PCP in 3-4 weeks -seems like had anxiety with prednisone, now resolved - follow up if symptoms return -Patient advised to return or notify a doctor immediately if symptoms worsen or persist or new concerns arise.  Patient Instructions  -take allegra and nasal steroid every day       Terressa Koyanagi.

## 2013-06-04 ENCOUNTER — Ambulatory Visit: Payer: 59 | Admitting: Internal Medicine

## 2013-06-22 ENCOUNTER — Other Ambulatory Visit: Payer: Self-pay | Admitting: Family Medicine

## 2013-06-22 ENCOUNTER — Encounter: Payer: Self-pay | Admitting: Internal Medicine

## 2013-06-22 ENCOUNTER — Ambulatory Visit (INDEPENDENT_AMBULATORY_CARE_PROVIDER_SITE_OTHER): Payer: 59 | Admitting: Internal Medicine

## 2013-06-22 DIAGNOSIS — M79606 Pain in leg, unspecified: Secondary | ICD-10-CM | POA: Insufficient documentation

## 2013-06-22 DIAGNOSIS — R6889 Other general symptoms and signs: Secondary | ICD-10-CM

## 2013-06-22 DIAGNOSIS — R69 Illness, unspecified: Secondary | ICD-10-CM

## 2013-06-22 DIAGNOSIS — I8390 Asymptomatic varicose veins of unspecified lower extremity: Secondary | ICD-10-CM

## 2013-06-22 DIAGNOSIS — D649 Anemia, unspecified: Secondary | ICD-10-CM

## 2013-06-22 DIAGNOSIS — M79605 Pain in left leg: Secondary | ICD-10-CM

## 2013-06-22 DIAGNOSIS — M79609 Pain in unspecified limb: Secondary | ICD-10-CM

## 2013-06-22 DIAGNOSIS — Z9289 Personal history of other medical treatment: Secondary | ICD-10-CM | POA: Insufficient documentation

## 2013-06-22 LAB — CBC WITH DIFFERENTIAL/PLATELET
Basophils Relative: 0.4 % (ref 0.0–3.0)
Eosinophils Absolute: 0.2 10*3/uL (ref 0.0–0.7)
HCT: 35 % — ABNORMAL LOW (ref 36.0–46.0)
Hemoglobin: 11.5 g/dL — ABNORMAL LOW (ref 12.0–15.0)
MCHC: 32.8 g/dL (ref 30.0–36.0)
MCV: 83.5 fl (ref 78.0–100.0)
Monocytes Absolute: 0.3 10*3/uL (ref 0.1–1.0)
Neutro Abs: 2.8 10*3/uL (ref 1.4–7.7)
RBC: 4.19 Mil/uL (ref 3.87–5.11)

## 2013-06-22 LAB — BASIC METABOLIC PANEL
CO2: 23 mEq/L (ref 19–32)
Chloride: 105 mEq/L (ref 96–112)
Sodium: 137 mEq/L (ref 135–145)

## 2013-06-22 NOTE — Patient Instructions (Addendum)
Will notify you  of labs when available.  Your anemia  can make you tired.   The leg pain could come from your back and you could have varicose veins . Will do test to rule out blood clots  . Elevate legs when you can   Walking is good also.  But we can do a referral to  Vascular consult if pain continues. And test for clot is negative .Marland Kitchen

## 2013-06-22 NOTE — Progress Notes (Signed)
Chief Complaint  Patient presents with  . Leg Pain    Left leg on and off for two weeks.  She thinks it may be bacause of her back.  Has not been feeling well for 2 days.  Would like lab work.  Pt is fasting.    HPI: Patient comes in today for SDA for  new problem evaluation. However she states she's actually been having this problem off and on for about  8- 9  Months  Radiating left leg. Discomfort and pain it's mostly from the knee down "i want to make sure its noyt a blood clot" hurt yesterday  Knee appears to be stable and not swollen. Her mother had varicose veins but no history of blood clots. Hard for her to define area  acht i[facility] place .   She does have a venous-looking area in the right anterior leg. No edema no falling No fever uti  Took aleve or ibu ocassionally some help.  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   Feels unwell light headed .  At times has had some of this before she tends to worry about her bodily symptoms Worried about heart no chest pain shortness of breath change in activity she still has. Sometimes they're quite heavy. Is a history of anemia. ROS: See pertinent positives and negatives per HPI. No fever cough abdominal pain.  Past Medical History  Diagnosis Date  . Anemia   . Headache(784.0)   . Heart murmur     h/o murmur- no probs  . Anxiety     h/o anxiety- related to GERD- thought her heart  was "acting" up  . GERD (gastroesophageal reflux disease)     no meds  . Shortness of breath     when climbing stairs  . Endometriosis   . History of nuclear stress test     normal 2006 nl EF    Family History  Problem Relation Age of Onset  . Hypertension Mother   . Kidney disease Mother     tumor on kidney, removed kidney  . Hypertension Maternal Aunt   . Cancer Maternal Grandfather     lung  . Diabetes Maternal Aunt   . Lung cancer      History   Social History  . Marital  Status: Single    Spouse Name: N/A    Number of Children: N/A  . Years of Education: N/A   Social History Main Topics  . Smoking status: Former Games developer  . Smokeless tobacco: None  . Alcohol Use: 1.8 oz/week    3 Glasses of wine per week     Comment: socially  . Drug Use: No  . Sexually Active:    Other Topics Concern  . 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                Outpatient Encounter Prescriptions as of 06/22/2013  Medication Sig Dispense Refill  . Menthol, Topical Analgesic, 5 % PADS Apply 1 patch topically as needed. For back pain      . Multiple Vitamins-Minerals (MULTIVITAMIN WITH MINERALS) tablet Take 1  tablet by mouth daily.      . Ascorbic Acid (VITAMIN C) 100 MG tablet Take 100 mg by mouth daily.        . fluticasone (VERAMYST) 27.5 MCG/SPRAY nasal spray Place 2 sprays into the nose daily.  10 g  5  . SUMAtriptan (IMITREX) 100 MG tablet Take 1 at onset of head ache and can repeat in  2 hours if needed.  6 tablet  1   No facility-administered encounter medications on file as of 06/22/2013.    EXAM:  BP 116/84  Pulse 69  Temp(Src) 98.1 F (36.7 C) (Oral)  Wt 216 lb (97.977 kg)  BMI 34.88 kg/m2  SpO2 98%  LMP 06/08/2013  Body mass index is 34.88 kg/(m^2).  GENERAL: vitals reviewed and listed above, alert, oriented, appears well hydrated and in no acute distress  HEENT: atraumatic, conjunctiva  clear, no obvious abnormalities on inspection of external nose and ears  NECK: no obvious masses on inspection palpation  No jvd  LUNGS: clear to auscultation bilaterally, no wheezes, rales or rhonchi, good air movement  CV: HRRR, no clubbing cyanosis or  peripheral edema nl cap refill I dont hear a murmur today  Abdomen:  Sof,t normal bowel sounds without hepatosplenomegaly, no guarding rebound or  masses no CVA tenderness MS: moves all extremities without noticeable focal  Abnormality gait normal left leg with patch of venous ? Veins left med anterior lef distal to knee no tenderness or point tenderness no sig edema trc  Pulsed intact   Neuro appears grosslynl  Nl gait and dtrs neg SLR  PSYCH: pleasant and cooperative, no obvious depression or anxiety  ASSESSMENT AND PLAN:  Discussed the following assessment and plan:  Pain, lower extremity, left - Plan: Basic metabolic panel, CBC with Differential, TSH, IBC panel, US Venous Img Lower Unilateral Left  Anemia - Plan: Basic metabolic panel, CBC with Differential, TSH, IBC panel, US Venous Img Lower Unilateral Left  Feeling unwell - Plan: Basic metabolic panel, CBC with Differential, TSH, IBC panel, US Venous Img Lower Unilateral Left  Varicose vein of leg - left. Uncertain cause of her leg discomfort. I doubt if it's a DVT but she does have varicose veins I believe and legs are down dependent over time. We'll get a Doppler and proceed from there consider other problems radiation from back or venous insufficiency. We can do a vascular consult depending on results and progress  She states she tends to worry about bodily symptoms but can be reassured I don't see anything on her exam today the alarming she does have a history of anemia we'll recheck this with chemical parameters today she looks hemodynamically stable -Patient advised to return or notify health care team  if symptoms worsen or persist or new concerns arise.  Patient Instructions  Will notify you  of labs when available.  Your anemia  can make you tired.   The leg pain could come from your back and you could have varicose veins . Will do test to rule out blood clots  . Elevate legs when you can   Walking is good also.  But we can do a referral to  Vascular consult if pain continues. And test for clot is negative .Marland Kitchen    Neta Mends. Panosh M.D.

## 2013-06-25 NOTE — Progress Notes (Signed)
Quick Note:  Tried to reach pt, no answer. ______

## 2013-06-26 ENCOUNTER — Telehealth: Payer: Self-pay | Admitting: Family Medicine

## 2013-06-26 NOTE — Telephone Encounter (Signed)
Error

## 2013-06-29 NOTE — Progress Notes (Signed)
Quick Note:  I spoke with pt ______ 

## 2013-06-30 ENCOUNTER — Encounter: Payer: Self-pay | Admitting: Internal Medicine

## 2013-07-07 ENCOUNTER — Encounter (INDEPENDENT_AMBULATORY_CARE_PROVIDER_SITE_OTHER): Payer: 59

## 2013-07-07 DIAGNOSIS — M79605 Pain in left leg: Secondary | ICD-10-CM

## 2013-07-07 DIAGNOSIS — R229 Localized swelling, mass and lump, unspecified: Secondary | ICD-10-CM

## 2013-07-07 DIAGNOSIS — D649 Anemia, unspecified: Secondary | ICD-10-CM

## 2013-07-07 DIAGNOSIS — M79609 Pain in unspecified limb: Secondary | ICD-10-CM

## 2013-07-16 ENCOUNTER — Encounter: Payer: Self-pay | Admitting: Family Medicine

## 2013-12-28 ENCOUNTER — Ambulatory Visit (INDEPENDENT_AMBULATORY_CARE_PROVIDER_SITE_OTHER): Payer: 59 | Admitting: Internal Medicine

## 2013-12-28 ENCOUNTER — Encounter: Payer: Self-pay | Admitting: Internal Medicine

## 2013-12-28 VITALS — BP 130/86 | HR 77 | Temp 98.6°F | Wt 218.0 lb

## 2013-12-28 DIAGNOSIS — J069 Acute upper respiratory infection, unspecified: Secondary | ICD-10-CM

## 2013-12-28 DIAGNOSIS — J329 Chronic sinusitis, unspecified: Secondary | ICD-10-CM

## 2013-12-28 MED ORDER — FLUTICASONE PROPIONATE 50 MCG/ACT NA SUSP: NASAL | Status: AC

## 2013-12-28 MED ORDER — AMOXICILLIN 500 MG PO CAPS: 500.0000 mg | ORAL_CAPSULE | Freq: Three times a day (TID) | ORAL | Status: DC

## 2013-12-28 NOTE — Patient Instructions (Signed)
Saline nose sprays  Decongestants day and afrrin for 3 nights to open up pressure area  Add antibiotic if p[ain and pressure if continuing.    Sinusitis Sinusitis is redness, soreness, and swelling (inflammation) of the paranasal sinuses. Paranasal sinuses are air pockets within the bones of your face (beneath the eyes, the middle of the forehead, or above the eyes). In healthy paranasal sinuses, mucus is able to drain out, and air is able to circulate through them by way of your nose. However, when your paranasal sinuses are inflamed, mucus and air can become trapped. This can allow bacteria and other germs to grow and cause infection. Sinusitis can develop quickly and last only a short time (acute) or continue over a long period (chronic). Sinusitis that lasts for more than 12 weeks is considered chronic.  CAUSES  Causes of sinusitis include:  Allergies.  Structural abnormalities, such as displacement of the cartilage that separates your nostrils (deviated septum), which can decrease the air flow through your nose and sinuses and affect sinus drainage.  Functional abnormalities, such as when the small hairs (cilia) that line your sinuses and help remove mucus do not work properly or are not present. SYMPTOMS  Symptoms of acute and chronic sinusitis are the same. The primary symptoms are pain and pressure around the affected sinuses. Other symptoms include:  Upper toothache.  Earache.  Headache.  Bad breath.  Decreased sense of smell and taste.  A cough, which worsens when you are lying flat.  Fatigue.  Fever.  Thick drainage from your nose, which often is green and may contain pus (purulent).  Swelling and warmth over the affected sinuses. DIAGNOSIS  Your caregiver will perform a physical exam. During the exam, your caregiver may:  Look in your nose for signs of abnormal growths in your nostrils (nasal polyps).  Tap over the affected sinus to check for signs of  infection.  View the inside of your sinuses (endoscopy) with a special imaging device with a light attached (endoscope), which is inserted into your sinuses. If your caregiver suspects that you have chronic sinusitis, one or more of the following tests may be recommended:  Allergy tests.  Nasal culture A sample of mucus is taken from your nose and sent to a lab and screened for bacteria.  Nasal cytology A sample of mucus is taken from your nose and examined by your caregiver to determine if your sinusitis is related to an allergy. TREATMENT  Most cases of acute sinusitis are related to a viral infection and will resolve on their own within 10 days. Sometimes medicines are prescribed to help relieve symptoms (pain medicine, decongestants, nasal steroid sprays, or saline sprays).  However, for sinusitis related to a bacterial infection, your caregiver will prescribe antibiotic medicines. These are medicines that will help kill the bacteria causing the infection.  Rarely, sinusitis is caused by a fungal infection. In theses cases, your caregiver will prescribe antifungal medicine. For some cases of chronic sinusitis, surgery is needed. Generally, these are cases in which sinusitis recurs more than 3 times per year, despite other treatments. HOME CARE INSTRUCTIONS   Drink plenty of water. Water helps thin the mucus so your sinuses can drain more easily.  Use a humidifier.  Inhale steam 3 to 4 times a day (for example, sit in the bathroom with the shower running).  Apply a warm, moist washcloth to your face 3 to 4 times a day, or as directed by your caregiver.  Use saline nasal  sprays to help moisten and clean your sinuses.  Take over-the-counter or prescription medicines for pain, discomfort, or fever only as directed by your caregiver. SEEK IMMEDIATE MEDICAL CARE IF:  You have increasing pain or severe headaches.  You have nausea, vomiting, or drowsiness.  You have swelling around your  face.  You have vision problems.  You have a stiff neck.  You have difficulty breathing. MAKE SURE YOU:   Understand these instructions.  Will watch your condition.  Will get help right away if you are not doing well or get worse. Document Released: 12/03/2005 Document Revised: 02/25/2012 Document Reviewed: 12/18/2011 [facility] Patient Information 2014 [place], [place].

## 2013-12-28 NOTE — Progress Notes (Signed)
Pre visit review using our clinic review tool, if applicable. No additional management support is needed unless otherwise documented below in the visit note. Chief Complaint  Patient presents with  . Sore Throat    Started on Thursday.  Has taken Benadryl on Friday and tried Mucinex Allergy yesterday.  Has not had any medications today.  . Otalgia  . Nasal Congestion  . Headache    HPI: Patient comes in today for SDA for  new problem evaluation. Onset right side congestion.  thrusday  4 fdays ago and right ear hurts with hiccough  Right sided with phelgm and sneezing  Has itchy cough at night  Reguarluy.  Is not taking the marrow mast is a cough. Tried some over-the-counter 6 no fever or chills. ROS: See pertinent positives and negatives per HPI. His about diabetes has some tingling decreased sensation on the medial aspect of one of her right toes. No trauma no neuropathy symptoms. Tingling for about a week.  Past Medical History  Diagnosis Date  . Anemia   . Headache(784.0)   . Heart murmur     h/o murmur- no probs  . Anxiety     h/o anxiety- related to GERD- thought her heart  was "acting" up  . GERD (gastroesophageal reflux disease)     no meds  . Shortness of breath     when climbing stairs  . Endometriosis   . History of nuclear stress test     normal 2006 nl EF    Family History  Problem Relation Age of Onset  . Hypertension Mother   . Kidney disease Mother     tumor on kidney, removed kidney  . Hypertension Maternal Aunt   . Cancer Maternal Grandfather     lung  . Diabetes Maternal Aunt   . Lung cancer      History   Social History  . Marital Status: Single    Spouse Name: N/A    Number of Children: N/A  . Years of Education: N/A   Social History Main Topics  . Smoking status: Former Games developer  . Smokeless tobacco: None  . Alcohol Use: 1.8 oz/week    3 Glasses of wine per week     Comment: socially  . Drug Use: No  . Sexual Activity:    Other Topics  Concern  . 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                Outpatient Encounter Prescriptions as of 12/28/2013  Medication Sig  . Menthol, Topical Analgesic, 5 % PADS Apply 1 patch topically as needed. For back pain  . Multiple Vitamins-Minerals (MULTIVITAMIN WITH MINERALS) tablet Take 1 tablet by mouth daily.  Marland Kitchen amoxicillin (AMOXIL) 500 MG capsule Take 1 capsule (500 mg total) by mouth 3 (three) times daily.  . fluticasone (FLONASE) 50 MCG/ACT nasal spray 2 spray each nostril qd  . fluticasone (VERAMYST) 27.5 MCG/SPRAY nasal spray Place 2 sprays into the nose daily.  . SUMAtriptan (IMITREX) 100 MG tablet Take 1 at onset of head ache and can repeat in  2 hours if needed.  . [DISCONTINUED] Ascorbic Acid (VITAMIN C) 100 MG tablet Take 100 mg by mouth daily.  EXAM:  BP 130/86  Pulse 77  Temp(Src) 98.6 F (37 C) (Oral)  Wt 218 lb (98.884 kg)  SpO2 97%  Body mass index is 35.2 kg/(m^2). WDWN in NAD  quiet respirations;  very upper congested  somewhat hoarse. Non toxic . HEENT: Normocephalic ;atraumatic , Eyes;  PERRL, EOMs  Full, lids and conjunctiva clear,,Ears: no deformities, canals nl, TM landmarks normal, Nose: no deformity  congested;face minimally right maxillary  tender Mouth : OP clear without lesion or edema . Neck: Supple without adenopathy or masses or bruitstender ac nodes right  Chest:  Clear to A&P without wheezes rales or rhonchi CV:  S1-S2 no gallops or murmurs peripheral perfusion is normal Skin :nl perfusion and no acute rashes  Gait grossly within normal limits toe not examined. ASSESSMENT AND PLAN:  Discussed the following assessment and plan:  Sinusitis - with eustachain tube dysfunction  URI, acute Right-sided could self resolved based on protocol symptomatic treatment add antibiotic if  needed. History of chronic rhinitis suspect allergic send in Flonase as tolerated less costly than Veramyst Good fitting shoes monitor her foot if persistent progressive recheck ear or with a foot doctor orthopedics. I don't think this is diabetes sounds like a local foot problem. -Patient advised to return or notify health care team  if symptoms worsen or persist or new concerns arise.  Patient Instructions  Saline nose sprays  Decongestants day and afrrin for 3 nights to open up pressure area  Add antibiotic if p[ain and pressure if continuing.    Sinusitis Sinusitis is redness, soreness, and swelling (inflammation) of the paranasal sinuses. Paranasal sinuses are air pockets within the bones of your face (beneath the eyes, the middle of the forehead, or above the eyes). In healthy paranasal sinuses, mucus is able to drain out, and air is able to circulate through them by way of your nose. However, when your paranasal sinuses are inflamed, mucus and air can become trapped. This can allow bacteria and other germs to grow and cause infection. Sinusitis can develop quickly and last only a short time (acute) or continue over a long period (chronic). Sinusitis that lasts for more than 12 weeks is considered chronic.  CAUSES  Causes of sinusitis include:  Allergies.  Structural abnormalities, such as displacement of the cartilage that separates your nostrils (deviated septum), which can decrease the air flow through your nose and sinuses and affect sinus drainage.  Functional abnormalities, such as when the small hairs (cilia) that line your sinuses and help remove mucus do not work properly or are not present. SYMPTOMS  Symptoms of acute and chronic sinusitis are the same. The primary symptoms are pain and pressure around the affected sinuses. Other symptoms include:  Upper toothache.  Earache.  Headache.  Bad breath.  Decreased sense of smell and taste.  A cough, which worsens when you  are lying flat.  Fatigue.  Fever.  Thick drainage from your nose, which often is green and may contain pus (purulent).  Swelling and warmth over the affected sinuses. DIAGNOSIS  Your caregiver will perform a physical exam. During the exam, your caregiver may:  Look in your nose for signs of abnormal growths in your nostrils (nasal polyps).  Tap over the affected sinus to check for signs of infection.  View the inside of your sinuses (endoscopy) with a special imaging device with a light attached (endoscope), which is inserted into your sinuses. If your caregiver suspects that you have chronic sinusitis, one or more of  the following tests may be recommended:  Allergy tests.  Nasal culture A sample of mucus is taken from your nose and sent to a lab and screened for bacteria.  Nasal cytology A sample of mucus is taken from your nose and examined by your caregiver to determine if your sinusitis is related to an allergy. TREATMENT  Most cases of acute sinusitis are related to a viral infection and will resolve on their own within 10 days. Sometimes medicines are prescribed to help relieve symptoms (pain medicine, decongestants, nasal steroid sprays, or saline sprays).  However, for sinusitis related to a bacterial infection, your caregiver will prescribe antibiotic medicines. These are medicines that will help kill the bacteria causing the infection.  Rarely, sinusitis is caused by a fungal infection. In theses cases, your caregiver will prescribe antifungal medicine. For some cases of chronic sinusitis, surgery is needed. Generally, these are cases in which sinusitis recurs more than 3 times per year, despite other treatments. HOME CARE INSTRUCTIONS   Drink plenty of water. Water helps thin the mucus so your sinuses can drain more easily.  Use a humidifier.  Inhale steam 3 to 4 times a day (for example, sit in the bathroom with the shower running).  Apply a warm, moist washcloth to  your face 3 to 4 times a day, or as directed by your caregiver.  Use saline nasal sprays to help moisten and clean your sinuses.  Take over-the-counter or prescription medicines for pain, discomfort, or fever only as directed by your caregiver. SEEK IMMEDIATE MEDICAL CARE IF:  You have increasing pain or severe headaches.  You have nausea, vomiting, or drowsiness.  You have swelling around your face.  You have vision problems.  You have a stiff neck.  You have difficulty breathing. MAKE SURE YOU:   Understand these instructions.  Will watch your condition.  Will get help right away if you are not doing well or get worse. Document Released: 12/03/2005 Document Revised: 02/25/2012 Document Reviewed: 12/18/2011 [facility] Patient Information 2014 [place], [place].      Neta Mends. Panosh M.D.

## 2013-12-29 ENCOUNTER — Telehealth: Payer: Self-pay | Admitting: Internal Medicine

## 2013-12-29 NOTE — Telephone Encounter (Signed)
Pt on 2nd day of amoxicillin. Pt wants to know how long before starts working.;t not feeling much better. Pt states she heard a zpak would work faster. Pharm: [facility] [address]

## 2013-12-30 NOTE — Telephone Encounter (Signed)
Patient notified to call back in 5 days if not feeling better.  Will then [facility] her antibiotic [facility]

## 2013-12-30 NOTE — Telephone Encounter (Signed)
Left message at the below listed number for the pt to return my call. 

## 2013-12-30 NOTE — Telephone Encounter (Signed)
May take  Up to 3-5 days of antibiotic to see effect  however if not improving in this time frame would change to augmentin  2000/125 bid for 10 days or other options ( z pack has higher resistant rate and not recommended for  Sinusitis unless allergic to other meds )

## 2014-01-04 ENCOUNTER — Telehealth: Payer: Self-pay | Admitting: Internal Medicine

## 2014-01-04 NOTE — Telephone Encounter (Signed)
I am not accepting any new patients.

## 2014-01-04 NOTE — Telephone Encounter (Signed)
Pt would like to switch to dr Kirtland Bouchardk. Pt felt like Dr Fabian SharpPanosh rush her out of room. Pt had other issues

## 2014-01-04 NOTE — Telephone Encounter (Signed)
Please advise 

## 2014-01-06 NOTE — Telephone Encounter (Signed)
Pt not at work yet

## 2014-01-07 NOTE — Telephone Encounter (Signed)
Pt is aware dr Kirtland Bouchardk decision. Pt would like to switch to dr Selena Battenkim

## 2014-02-03 NOTE — Telephone Encounter (Signed)
No I cannot take her, thanks

## 2014-02-03 NOTE — Telephone Encounter (Signed)
Pt would like to see if Dr. Clent RidgesFry will accept her as a new patient.

## 2014-02-03 NOTE — Telephone Encounter (Signed)
Pt is now asking for Dr. Caryl NeverBurchette instead of Dr. Selena BattenKim

## 2014-02-03 NOTE — Telephone Encounter (Signed)
Pt is aware that Dr. Clent RidgesFry isn't accepting new patients.  She would like to know if Dr. Selena BattenKim will accept her as a new pt?

## 2014-02-04 NOTE — Telephone Encounter (Signed)
Pt called back wanting to know about MD switch.  I advised her that my supervisor is going to contact her about it.

## 2014-02-05 NOTE — Telephone Encounter (Signed)
Due to the multiple requests for change in providers I returned call to patient to discuss matter further.  Patient states she is not a complicated patient and just wants a provider with good bed side manners who will take the time to listen to her and not rush her out of the room when she comes in for sick visits.  Pt feels the issue with the tingling in her toes was not properly address by her PCP because additional testing or follow up care was provided.   Pt states she was asking for providers who friends and coworkers were recommending to her.  Patient declined Dr. Selena BattenKim and asked if she could be switched to [facility]. I informed her that I would ask, however, it she will not accept her then we would not have any other providers at our site who are accepting new patients.  Please advise if ok to switch patient from Panosh to [place].

## 2014-02-05 NOTE — Telephone Encounter (Signed)
She should switch providers . Either way  Based on these communications .  Please change heading to whichever provider  Accepts her.

## 2014-02-08 NOTE — Telephone Encounter (Signed)
She really seems like she is unsure of who she want to provide her care. I would suggest she stay with Dr. Fabian SharpPanosh.

## 2014-02-08 NOTE — Telephone Encounter (Signed)
Pt notified she is unable to switch providers.

## 2014-03-16 ENCOUNTER — Ambulatory Visit: Payer: 59 | Admitting: Physician Assistant

## 2014-03-16 DIAGNOSIS — Z0289 Encounter for other administrative examinations: Secondary | ICD-10-CM

## 2015-05-17 ENCOUNTER — Encounter (HOSPITAL_COMMUNITY): Payer: Self-pay | Admitting: Emergency Medicine

## 2015-05-17 DIAGNOSIS — Z862 Personal history of diseases of the blood and blood-forming organs and certain disorders involving the immune mechanism: Secondary | ICD-10-CM | POA: Insufficient documentation

## 2015-05-17 DIAGNOSIS — J019 Acute sinusitis, unspecified: Secondary | ICD-10-CM | POA: Insufficient documentation

## 2015-05-17 DIAGNOSIS — Z87891 Personal history of nicotine dependence: Secondary | ICD-10-CM | POA: Insufficient documentation

## 2015-05-17 DIAGNOSIS — Z8719 Personal history of other diseases of the digestive system: Secondary | ICD-10-CM | POA: Diagnosis not present

## 2015-05-17 DIAGNOSIS — Z793 Long term (current) use of hormonal contraceptives: Secondary | ICD-10-CM | POA: Insufficient documentation

## 2015-05-17 DIAGNOSIS — Z8659 Personal history of other mental and behavioral disorders: Secondary | ICD-10-CM | POA: Insufficient documentation

## 2015-05-17 DIAGNOSIS — J029 Acute pharyngitis, unspecified: Secondary | ICD-10-CM | POA: Diagnosis present

## 2015-05-17 DIAGNOSIS — I10 Essential (primary) hypertension: Secondary | ICD-10-CM | POA: Diagnosis not present

## 2015-05-17 DIAGNOSIS — R011 Cardiac murmur, unspecified: Secondary | ICD-10-CM | POA: Insufficient documentation

## 2015-05-17 DIAGNOSIS — Z8742 Personal history of other diseases of the female genital tract: Secondary | ICD-10-CM | POA: Insufficient documentation

## 2015-05-17 NOTE — ED Notes (Signed)
Pt reports for past several days she has been having scratchy throat and sinus congestions. Today her bp has been elevated and c/o headache. bp in traige 158/92

## 2015-05-18 ENCOUNTER — Emergency Department (HOSPITAL_COMMUNITY)
Admission: EM | Admit: 2015-05-18 | Discharge: 2015-05-18 | Disposition: A | Discharge status: Home / Self Care | Payer: 59 | Attending: Emergency Medicine | Admitting: Emergency Medicine

## 2015-05-18 DIAGNOSIS — J019 Acute sinusitis, unspecified: Secondary | ICD-10-CM

## 2015-05-18 DIAGNOSIS — R51 Headache: Secondary | ICD-10-CM

## 2015-05-18 DIAGNOSIS — R519 Headache, unspecified: Secondary | ICD-10-CM

## 2015-05-18 NOTE — Discharge Instructions (Signed)
Sinusitis °Sinusitis is redness, soreness, and inflammation of the paranasal sinuses. Paranasal sinuses are air pockets within the bones of your face (beneath the eyes, the middle of the forehead, or above the eyes). In healthy paranasal sinuses, mucus is able to drain out, and air is able to circulate through them by way of your nose. However, when your paranasal sinuses are inflamed, mucus and air can become trapped. This can allow bacteria and other germs to grow and cause infection. °Sinusitis can develop quickly and last only a short time (acute) or continue over a long period (chronic). Sinusitis that lasts for more than 12 weeks is considered chronic.  °CAUSES  °Causes of sinusitis include: °· Allergies. °· Structural abnormalities, such as displacement of the cartilage that separates your nostrils (deviated septum), which can decrease the air flow through your nose and sinuses and affect sinus drainage. °· Functional abnormalities, such as when the small hairs (cilia) that line your sinuses and help remove mucus do not work properly or are not present. °SIGNS AND SYMPTOMS  °Symptoms of acute and chronic sinusitis are the same. The primary symptoms are pain and pressure around the affected sinuses. Other symptoms include: °· Upper toothache. °· Earache. °· Headache. °· Bad breath. °· Decreased sense of smell and taste. °· A cough, which worsens when you are lying flat. °· Fatigue. °· Fever. °· Thick drainage from your nose, which often is green and may contain pus (purulent). °· Swelling and warmth over the affected sinuses. °DIAGNOSIS  °Your health care provider will perform a physical exam. During the exam, your health care provider may: °· Look in your nose for signs of abnormal growths in your nostrils (nasal polyps). °· Tap over the affected sinus to check for signs of infection. °· View the inside of your sinuses (endoscopy) using an imaging device that has a light attached (endoscope). °If your health  care provider suspects that you have chronic sinusitis, one or more of the following tests may be recommended: °· Allergy tests. °· Nasal culture. A sample of mucus is taken from your nose, sent to a lab, and screened for bacteria. °· Nasal cytology. A sample of mucus is taken from your nose and examined by your health care provider to determine if your sinusitis is related to an allergy. °TREATMENT  °Most cases of acute sinusitis are related to a viral infection and will resolve on their own within 10 days. Sometimes medicines are prescribed to help relieve symptoms (pain medicine, decongestants, nasal steroid sprays, or saline sprays).  °However, for sinusitis related to a bacterial infection, your health care provider will prescribe antibiotic medicines. These are medicines that will help kill the bacteria causing the infection.  °Rarely, sinusitis is caused by a fungal infection. In theses cases, your health care provider will prescribe antifungal medicine. °For some cases of chronic sinusitis, surgery is needed. Generally, these are cases in which sinusitis recurs more than 3 times per year, despite other treatments. °HOME CARE INSTRUCTIONS  °· Drink plenty of water. Water helps thin the mucus so your sinuses can drain more easily. °· Use a humidifier. °· Inhale steam 3 to 4 times a day (for example, sit in the bathroom with the shower running). °· Apply a warm, moist washcloth to your face 3 to 4 times a day, or as directed by your health care provider. °· Use saline nasal sprays to help moisten and clean your sinuses. °· Take medicines only as directed by your health care provider. °·   If you were prescribed either an antibiotic or antifungal medicine, finish it all even if you start to feel better. °SEEK IMMEDIATE MEDICAL CARE IF: °· You have increasing pain or severe headaches. °· You have nausea, vomiting, or drowsiness. °· You have swelling around your face. °· You have vision problems. °· You have a stiff  neck. °· You have difficulty breathing. °MAKE SURE YOU:  °· Understand these instructions. °· Will watch your condition. °· Will get help right away if you are not doing well or get worse. °Document Released: 12/03/2005 Document Revised: 04/19/2014 Document Reviewed: 12/18/2011 °ExitCare® Patient Information ©2015 [employer]. This information is not intended to replace advice given to you by your health care provider. Make sure you discuss any questions you have with your health care provider. ° °Sinus Headache °A sinus headache is when your sinuses become clogged or swollen. Sinus headaches can range from mild to severe.  °CAUSES °A sinus headache can have different causes, such as: °· Colds. °· Sinus infections. °· Allergies. °SYMPTOMS  °Symptoms of a sinus headache may vary and can include: °· Headache. °· Pain or pressure in the face. °· Congested or runny nose. °· Fever. °· Inability to smell. °· Pain in upper teeth. °Weather changes can make symptoms worse. °TREATMENT  °The treatment of a sinus headache depends on the cause. °· Sinus pain caused by a sinus infection may be treated with antibiotic medicine. °· Sinus pain caused by allergies may be helped by allergy medicines (antihistamines) and medicated nasal sprays. °· Sinus pain caused by congestion may be helped by flushing the nose and sinuses with saline solution. °HOME CARE INSTRUCTIONS  °· If antibiotics are prescribed, take them as directed. Finish them even if you start to feel better. °· Only take over-the-counter or prescription medicines for pain, discomfort, or fever as directed by your caregiver. °· If you have congestion, use a nasal spray to help reduce pressure. °SEEK IMMEDIATE MEDICAL CARE IF: °· You have a fever. °· You have headaches more than once a week. °· You have sensitivity to light or sound. °· You have repeated nausea and vomiting. °· You have vision problems. °· You have sudden, severe pain in your face or head. °· You have a  seizure. °· You are confused. °· Your sinus headaches do not get better after treatment. Many people think they have a sinus headache when they actually have migraines or tension headaches. °MAKE SURE YOU:  °· Understand these instructions. °· Will watch your condition. °· Will get help right away if you are not doing well or get worse. °Document Released: 01/10/2005 Document Revised: 02/25/2012 Document Reviewed: 03/03/2011 °ExitCare® Patient Information ©2015 [employer]. This information is not intended to replace advice given to you by your health care provider. Make sure you discuss any questions you have with your health care provider. ° °

## 2015-05-18 NOTE — ED Provider Notes (Signed)
CSN: [id number]     Arrival date & time 05/17/15  2110 History   First MD Initiated Contact with Patient 05/18/15 0013     Chief Complaint  Patient presents with  . Sore Throat  . Hypertension     (Consider location/radiation/quality/duration/timing/severity/associated sxs/prior Treatment) HPI Comments: 39 year old female with a history of anemia, headache, anxiety, and reflux presents to the emergency department for concern for high blood pressure. Patient saw her primary care doctor today and was diagnosed with a sinus infection and sinus headache. Patient reports that she had high blood pressure in the office and was discharged home with Zyrtec and Flonase. Patient took Sudafed at 1600 and checked her blood pressure a few hours later. She reports that her hypertension had persisted. She also states that she had a very bad sinus headache at this time which has resolved. Patient denies any vision loss, nausea, vomiting, or extremity weakness. No chest pain or shortness of breath. Patient denies a history of hypertension. She has never been on blood pressure medications.  Patient is a 39 y.o. female presenting with pharyngitis and hypertension. The history is provided by the patient. No language interpreter was used.  Sore Throat Associated symptoms include headaches. Pertinent negatives include no fever.  Hypertension Associated symptoms include headaches. Pertinent negatives include no fever.    Past Medical History  Diagnosis Date  . Anemia   . Headache(784.0)   . Heart murmur     h/o murmur- no probs  . Anxiety     h/o anxiety- related to GERD- thought her heart  was "acting" up  . GERD (gastroesophageal reflux disease)     no meds  . Shortness of breath     when climbing stairs  . Endometriosis   . History of nuclear stress test     normal 2006 nl EF   Past Surgical History  Procedure Laterality Date  . Laparoscopic ovarian cystectomy      right endometriosis    Family  History  Problem Relation Age of Onset  . Hypertension Mother   . Kidney disease Mother     tumor on kidney, removed kidney  . Hypertension Maternal Aunt   . Cancer Maternal Grandfather     lung  . Diabetes Maternal Aunt   . Lung cancer     History  Substance Use Topics  . Smoking status: Former Games developer  . Smokeless tobacco: Not on file  . Alcohol Use: 1.8 oz/week    3 Glasses of wine per week     Comment: socially   OB History    No data available      Review of Systems  Constitutional: Negative for fever.  HENT: Positive for sinus pressure. Negative for trouble swallowing.   Neurological: Positive for headaches.  All other systems reviewed and are negative.   Allergies  Review of patient's allergies indicates no known allergies.  Home Medications   Prior to Admission medications   Medication Sig Start Date End Date Taking? Authorizing Provider  fluticasone (FLONASE) 50 MCG/ACT nasal spray 2 spray each nostril qd 12/28/13  Yes Madelin Headings, MD  Menthol, Topical Analgesic, 5 % PADS Apply 1 patch topically as needed. For back pain   Yes Historical Provider, MD  Multiple Vitamins-Minerals (MULTIVITAMIN WITH MINERALS) tablet Take 1 tablet by mouth daily.   Yes Historical Provider, MD  naproxen sodium (ANAPROX) 220 MG tablet Take 440 mg by mouth daily as needed (headache).   Yes Historical Provider, MD  norethindrone-ethinyl  estradiol (JUNEL FE,GILDESS FE,LOESTRIN FE) 1-20 MG-MCG tablet Take 1 tablet by mouth daily.   Yes Historical Provider, MD  SUMAtriptan (IMITREX) 100 MG tablet Take 1 at onset of head ache and can repeat in  2 hours if needed. Patient not taking: Reported on 05/17/2015 01/09/13   Madelin HeadingsWanda K Panosh, MD   BP 138/88 mmHg  Pulse 76  Temp(Src) 98.6 F (37 C) (Oral)  Resp 20  SpO2 100%  LMP 05/12/2015   Physical Exam  Constitutional: She is oriented to person, place, and time. She appears well-developed and well-nourished. No distress.  Nontoxic/nonseptic  appearing  HENT:  Head: Normocephalic and atraumatic.  Right Ear: Tympanic membrane, external ear and ear canal normal.  Left Ear: Tympanic membrane, external ear and ear canal normal.  Nose: Mucosal edema present. No rhinorrhea. Right sinus exhibits maxillary sinus tenderness. Left sinus exhibits maxillary sinus tenderness.  Mouth/Throat: Uvula is midline, oropharynx is clear and moist and mucous membranes are normal.  Mild mucosal edema and audible nasal congestion. Uvula midline. Oropharynx clear. Patient tolerating secretions without difficulty.  Eyes: Conjunctivae and EOM are normal. Pupils are equal, round, and reactive to light. No scleral icterus.  Neck: Normal range of motion.  Cardiovascular: Normal rate, regular rhythm and intact distal pulses.   Pulmonary/Chest: Effort normal and breath sounds normal. No respiratory distress. She has no wheezes. She has no rales.  Respirations even and unlabored. Lungs clear.  Musculoskeletal: Normal range of motion.  Neurological: She is alert and oriented to person, place, and time. No cranial nerve deficit. She exhibits normal muscle tone. Coordination normal.  GCS 15. Speech is goal oriented. No focal neurologic deficits appreciated. Patient ambulatory with steady gait.  Skin: Skin is warm and dry. No rash noted. She is not diaphoretic. No erythema. No pallor.  Psychiatric: She has a normal mood and affect. Her behavior is normal.  Nursing note and vitals reviewed.   ED Course  Procedures (including critical care time) Labs Review Labs Reviewed - No data to display  Imaging Review No results found.   EKG Interpretation None      MDM   Final diagnoses:  Acute sinusitis, recurrence not specified, unspecified location  Sinus headache    39 year old female presents to the emergency department with concern for hypertension. Patient with blood pressure of 158/92 on arrival. She reports taking Sudafed this evening. She was also  experiencing a bad headache on arrival which has resolved. Neurologic exam today is nonfocal. Blood pressure has improved with resolution of headache. Suspect HTN to be transient and directly influenced by patient's headache pain, anxiety, and self administering Sudafed. Patient has no chest pain or shortness of breath. No indication for further emergent workup at this time. Patient stable for discharge with instruction to follow-up with her primary care doctor for recheck of her blood pressure in one week. Return precautions discussed and provided. Patient agreeable to plan with no unaddressed concerns. Patient discharged in good condition.   Filed Vitals:   05/17/15 2124 05/18/15 0006  BP: 158/92 138/88  Pulse: 72 76  Temp: 98.6 F (37 C)   TempSrc: Oral   Resp: 16 20  SpO2: 100% 100%     Antony MaduraKelly Jasa Dundon, PA-C 05/18/15 [id number]  Purvis SheffieldForrest Harrison, MD 05/18/15 1536

## 2015-05-25 ENCOUNTER — Emergency Department (INDEPENDENT_AMBULATORY_CARE_PROVIDER_SITE_OTHER)
Admission: EM | Admit: 2015-05-25 | Discharge: 2015-05-25 | Disposition: A | Discharge status: Home / Self Care | Payer: PRIVATE HEALTH INSURANCE | Source: Home / Self Care | Attending: Family Medicine | Admitting: Family Medicine

## 2015-05-25 ENCOUNTER — Encounter (HOSPITAL_COMMUNITY): Payer: Self-pay | Admitting: Emergency Medicine

## 2015-05-25 DIAGNOSIS — S8001XA Contusion of right knee, initial encounter: Secondary | ICD-10-CM

## 2015-05-25 MED ORDER — IBUPROFEN 800 MG PO TABS: 800.0000 mg | ORAL_TABLET | Freq: Three times a day (TID) | ORAL | Status: DC

## 2015-05-25 NOTE — Discharge Instructions (Signed)
Ice and medicine as needed, see orthopedist if further concerns.

## 2015-05-25 NOTE — ED Provider Notes (Signed)
CSN: [id number]     Arrival date & time 05/25/15  1819 History   First MD Initiated Contact with Patient 05/25/15 1934     Chief Complaint  Patient presents with  . Knee Pain   (Consider location/radiation/quality/duration/timing/severity/associated sxs/prior Treatment) Patient is a 39 y.o. female presenting with knee pain. The history is provided by the patient.  Knee Pain Location:  Knee Injury: yes   Mechanism of injury: fall   Mechanism of injury comment:  At a restaraunt on mon and slipped on floor falling forward onto right knee, still sore. Fall:    Impact surface:  Hard floor   Point of impact:  Knees Knee location:  R knee Pain details:    Severity:  Mild   Progression:  Unchanged Chronicity:  New Dislocation: no   Relieved by:  None tried Worsened by:  Nothing tried Ineffective treatments:  None tried Associated symptoms: no back pain and no decreased ROM   Risk factors: obesity     Past Medical History  Diagnosis Date  . Anemia   . Headache(784.0)   . Heart murmur     h/o murmur- no probs  . Anxiety     h/o anxiety- related to GERD- thought her heart  was "acting" up  . GERD (gastroesophageal reflux disease)     no meds  . Shortness of breath     when climbing stairs  . Endometriosis   . History of nuclear stress test     normal 2006 nl EF   Past Surgical History  Procedure Laterality Date  . Laparoscopic ovarian cystectomy      right endometriosis    Family History  Problem Relation Age of Onset  . Hypertension Mother   . Kidney disease Mother     tumor on kidney, removed kidney  . Hypertension Maternal Aunt   . Cancer Maternal Grandfather     lung  . Diabetes Maternal Aunt   . Lung cancer     History  Substance Use Topics  . Smoking status: Former Games developermoker  . Smokeless tobacco: Not on file  . Alcohol Use: 1.8 oz/week    3 Glasses of wine per week     Comment: socially   OB History    No data available     Review of Systems    Constitutional: Negative.   Musculoskeletal: Positive for gait problem. Negative for back pain and joint swelling.    Allergies  Review of patient's allergies indicates no known allergies.  Home Medications   Prior to Admission medications   Medication Sig Start Date End Date Taking? Authorizing Provider  fluticasone (FLONASE) 50 MCG/ACT nasal spray 2 spray each nostril qd 12/28/13   Madelin HeadingsWanda K Panosh, MD  ibuprofen (ADVIL,MOTRIN) 800 MG tablet Take 1 tablet (800 mg total) by mouth 3 (three) times daily. For knee pain 05/25/15   Linna HoffJames D Kindl, MD  Menthol, Topical Analgesic, 5 % PADS Apply 1 patch topically as needed. For back pain    Historical Provider, MD  Multiple Vitamins-Minerals (MULTIVITAMIN WITH MINERALS) tablet Take 1 tablet by mouth daily.    Historical Provider, MD  naproxen sodium (ANAPROX) 220 MG tablet Take 440 mg by mouth daily as needed (headache).    Historical Provider, MD  norethindrone-ethinyl estradiol (JUNEL FE,GILDESS FE,LOESTRIN FE) 1-20 MG-MCG tablet Take 1 tablet by mouth daily.    Historical Provider, MD  SUMAtriptan (IMITREX) 100 MG tablet Take 1 at onset of head ache and can repeat in  2  hours if needed. Patient not taking: Reported on 05/17/2015 01/09/13   Madelin Headings, MD   BP 137/94 mmHg  Pulse 76  Temp(Src) 98.2 F (36.8 C) (Oral)  Resp 16  SpO2 98%  LMP 05/12/2015 Physical Exam  Constitutional: She is oriented to person, place, and time. She appears well-developed and well-nourished. No distress.  Musculoskeletal: Normal range of motion. She exhibits tenderness.       Right knee: She exhibits normal range of motion, no swelling, no effusion, no deformity, normal alignment, no LCL laxity and normal patellar mobility. Tenderness found. Patellar tendon tenderness noted. No medial joint line, no lateral joint line, no MCL and no LCL tenderness noted.       Legs: Neurological: She is alert and oriented to person, place, and time.  Skin: Skin is warm. No rash  noted.  Nursing note and vitals reviewed.   ED Course  Procedures (including critical care time) Labs Review Labs Reviewed - No data to display  Imaging Review No results found.   MDM   1. Patellar contusion, right, initial encounter        Linna Hoff, MD 05/30/15 1756

## 2015-05-25 NOTE — ED Notes (Signed)
Right knee pain after a fall on Monday.  Reports slipping in water.

## 2016-04-22 ENCOUNTER — Emergency Department (HOSPITAL_COMMUNITY): Payer: 59

## 2016-04-22 ENCOUNTER — Emergency Department (HOSPITAL_COMMUNITY)
Admission: EM | Admit: 2016-04-22 | Discharge: 2016-04-22 | Disposition: A | Discharge status: Home / Self Care | Payer: 59 | Attending: Emergency Medicine | Admitting: Emergency Medicine

## 2016-04-22 ENCOUNTER — Encounter (HOSPITAL_COMMUNITY): Payer: Self-pay | Admitting: Emergency Medicine

## 2016-04-22 DIAGNOSIS — Z87891 Personal history of nicotine dependence: Secondary | ICD-10-CM | POA: Insufficient documentation

## 2016-04-22 DIAGNOSIS — Z791 Long term (current) use of non-steroidal anti-inflammatories (NSAID): Secondary | ICD-10-CM | POA: Diagnosis not present

## 2016-04-22 DIAGNOSIS — K219 Gastro-esophageal reflux disease without esophagitis: Secondary | ICD-10-CM | POA: Diagnosis not present

## 2016-04-22 DIAGNOSIS — R202 Paresthesia of skin: Secondary | ICD-10-CM | POA: Insufficient documentation

## 2016-04-22 DIAGNOSIS — Z79899 Other long term (current) drug therapy: Secondary | ICD-10-CM | POA: Insufficient documentation

## 2016-04-22 DIAGNOSIS — Z7952 Long term (current) use of systemic steroids: Secondary | ICD-10-CM | POA: Insufficient documentation

## 2016-04-22 LAB — CBC WITH DIFFERENTIAL/PLATELET
BASOS ABS: 0 10*3/uL (ref 0.0–0.1)
BASOS PCT: 0 %
Eosinophils Absolute: 0.1 10*3/uL (ref 0.0–0.7)
Eosinophils Relative: 2 %
HEMATOCRIT: 37.8 % (ref 36.0–46.0)
Hemoglobin: 12.6 g/dL (ref 12.0–15.0)
LYMPHS PCT: 32 %
Lymphs Abs: 1.9 10*3/uL (ref 0.7–4.0)
MCH: 25.6 pg — ABNORMAL LOW (ref 26.0–34.0)
MCHC: 33.3 g/dL (ref 30.0–36.0)
MCV: 76.7 fL — AB (ref 78.0–100.0)
Monocytes Absolute: 0.2 10*3/uL (ref 0.1–1.0)
Monocytes Relative: 4 %
NEUTROS ABS: 3.7 10*3/uL (ref 1.7–7.7)
Neutrophils Relative %: 62 %
PLATELETS: 273 10*3/uL (ref 150–400)
RBC: 4.93 MIL/uL (ref 3.87–5.11)
RDW: 14.2 % (ref 11.5–15.5)
WBC: 6 10*3/uL (ref 4.0–10.5)

## 2016-04-22 LAB — COMPREHENSIVE METABOLIC PANEL
ALBUMIN: 3.9 g/dL (ref 3.5–5.0)
ALT: 13 U/L — AB (ref 14–54)
AST: 14 U/L — AB (ref 15–41)
Alkaline Phosphatase: 52 U/L (ref 38–126)
Anion gap: 7 (ref 5–15)
BUN: 15 mg/dL (ref 6–20)
CHLORIDE: 108 mmol/L (ref 101–111)
CO2: 23 mmol/L (ref 22–32)
CREATININE: 0.77 mg/dL (ref 0.44–1.00)
Calcium: 8.7 mg/dL — ABNORMAL LOW (ref 8.9–10.3)
GFR calc Af Amer: >60 mL/min (ref >60)
GLUCOSE: 107 mg/dL — AB (ref 65–99)
POTASSIUM: 3.9 mmol/L (ref 3.5–5.1)
Sodium: 138 mmol/L (ref 135–145)
Total Bilirubin: 0.7 mg/dL (ref 0.3–1.2)
Total Protein: 7.2 g/dL (ref 6.5–8.1)

## 2016-04-22 LAB — MAGNESIUM: Magnesium: 1.7 mg/dL (ref 1.7–2.4)

## 2016-04-22 LAB — I-STAT TROPONIN, ED: Troponin i, poc: 0.01 ng/mL (ref 0.00–0.08)

## 2016-04-22 LAB — HCG, SERUM, QUALITATIVE: PREG SERUM: NEGATIVE

## 2016-04-22 NOTE — ED Notes (Addendum)
Went to work this a.m. When her bilateral forearms began to "feel funny and tingly." Also states her left palm feels "funny" as well as her throat and across her upper chest "feels tingly and different." Denies pain, SOB, N/V, back pain. Appears anxious in triage.

## 2016-04-22 NOTE — ED Notes (Signed)
Patient transported to X-ray 

## 2016-04-22 NOTE — ED Provider Notes (Signed)
CSN: [id number]     Arrival date & time 04/22/16  1023 History   First MD Initiated Contact with Patient 04/22/16 1044     Chief Complaint  Patient presents with  . Tingling     (Consider location/radiation/quality/duration/timing/severity/associated sxs/prior Treatment) HPI 40 year old female who presents with bilateral forearm tingling. She has a history of anemia, migraine headache, GERD, and endometriosis. States that she otherwise has been in her usual state of health. Earlier today at around 9 to 9:30 while walking back to her work does she felt a tingling sensation in her bilateral forearms down to her fingertips. States that this is evident to her before, although it typically resolves quickly. States that her symptoms had seemed more persistent and she wanted to make sure everything was okay. Notes that she had a "funny" sensation over her chest wall of her neck anteriorly. She did not have any shortness of breath, diaphoresis, lightheadedness, syncope or near syncope, nausea or vomiting, weakness, vision or speech symptoms. No back pain or neck pain. Denies true chest pain and true numbness but just states that her forearms had felt funny. No recent illnesses including fevers, chills, diarrhea, urinary complaints, cough or upper respiratory symptoms. Denies that she was anxious or experiencing stressors as the time. States that she ate a lot of salty foods over past 1-2 days and thinks maybe it may be due to that.  Past Medical History  Diagnosis Date  . Anemia   . Headache(784.0)   . Heart murmur     h/o murmur- no probs  . Anxiety     h/o anxiety- related to GERD- thought her heart  was "acting" up  . GERD (gastroesophageal reflux disease)     no meds  . Shortness of breath     when climbing stairs  . Endometriosis   . History of nuclear stress test     normal 2006 nl EF   Past Surgical History  Procedure Laterality Date  . Laparoscopic ovarian cystectomy      right  endometriosis    Family History  Problem Relation Age of Onset  . Hypertension Mother   . Kidney disease Mother     tumor on kidney, removed kidney  . Hypertension Maternal Aunt   . Cancer Maternal Grandfather     lung  . Diabetes Maternal Aunt   . Lung cancer     Social History  Substance Use Topics  . Smoking status: Former Games developer  . Smokeless tobacco: None  . Alcohol Use: 1.8 oz/week    3 Glasses of wine per week     Comment: socially   OB History    No data available     Review of Systems 10/14 systems reviewed and are negative other than those stated in the HPI   Allergies  Review of patient's allergies indicates no known allergies.  Home Medications   Prior to Admission medications   Medication Sig Start Date End Date Taking? Authorizing Provider  fluticasone (FLONASE) 50 MCG/ACT nasal spray 2 spray each nostril qd Patient taking differently: Place 1 spray into both nostrils daily.  12/28/13  Yes Madelin Headings, MD  norethindrone-ethinyl estradiol (JUNEL FE,GILDESS FE,LOESTRIN FE) 1-20 MG-MCG tablet Take 1 tablet by mouth daily.   Yes Historical Provider, MD  ibuprofen (ADVIL,MOTRIN) 800 MG tablet Take 1 tablet (800 mg total) by mouth 3 (three) times daily. For knee pain Patient not taking: Reported on 04/22/2016 05/25/15   Linna Hoff, MD  Multiple Vitamins-Minerals (  MULTIVITAMIN WITH MINERALS) tablet Take 1 tablet by mouth daily.    Historical Provider, MD  naproxen sodium (ANAPROX) 220 MG tablet Take 440 mg by mouth daily as needed (headache).    Historical Provider, MD  SUMAtriptan (IMITREX) 100 MG tablet Take 1 at onset of head ache and can repeat in  2 hours if needed. Patient not taking: Reported on 05/17/2015 01/09/13   Madelin Headings, MD   BP 150/104 mmHg  Pulse 79  Temp(Src) 98.8 F (37.1 C) (Oral)  Resp 18  SpO2 100%  LMP 04/14/2016 Physical Exam Physical Exam  Nursing note and vitals reviewed. Constitutional: Well developed, well nourished,  non-toxic, and in no acute distress Head: Normocephalic and atraumatic.  Mouth/Throat: Oropharynx is clear and moist.  Neck: Normal range of motion. Neck supple.  Cardiovascular: Normal rate and regular rhythm.   Pulmonary/Chest: Effort normal and breath sounds normal.  Abdominal: Soft. There is no tenderness. There is no rebound and no guarding.  Musculoskeletal: Normal range of motion.  Skin: Skin is warm and dry.  Psychiatric: Cooperative Neurological:  Alert, oriented to person, place, time, and situation. Memory grossly in tact. Fluent speech. No dysarthria or aphasia.  Cranial nerves: VF are full. Pupils are symmetric, and reactive to light. EOMI without nystagmus. No gaze deviation. Facial muscles symmetric with activation. Sensation to light touch over face in tact bilaterally. Hearing grossly in tact. Palate elevates symmetrically. Head turn and shoulder shrug are intact. Tongue midline.  Reflexes defered.  Muscle bulk and tone normal. No pronator drift. Moves all extremities symmetrically. Sensation to light touch is in tact throughout in bilateral upper and lower extremities. Coordination reveals no dysmetria with finger to nose. Gait is narrow-based and steady. Non-ataxic.  ED Course  Procedures (including critical care time) Labs Review Labs Reviewed  CBC WITH DIFFERENTIAL/PLATELET - Abnormal; Notable for the following:    MCV 76.7 (*)    MCH 25.6 (*)    All other components within normal limits  COMPREHENSIVE METABOLIC PANEL - Abnormal; Notable for the following:    Glucose, Bld 107 (*)    Calcium 8.7 (*)    AST 14 (*)    ALT 13 (*)    All other components within normal limits  HCG, SERUM, QUALITATIVE  MAGNESIUM  I-STAT TROPOININ, ED    Imaging Review Dg Chest 2 View  04/22/2016  CLINICAL DATA:  Strange sensation in chest. EXAM: CHEST  2 VIEW COMPARISON:  None. FINDINGS: The heart size and mediastinal contours are within normal limits. Both lungs are clear. The  visualized skeletal structures are unremarkable. IMPRESSION: No active cardiopulmonary disease. Electronically Signed   By: Gerome Sam III M.D   On: 04/22/2016 12:05   I have personally reviewed and evaluated these images and lab results as part of my medical decision-making.   EKG Interpretation   Date/Time:  Sunday Apr 22 2016 11:49:23 EDT Ventricular Rate:  65 PR Interval:  153 QRS Duration: 84 QT Interval:  390 QTC Calculation: 405 R Axis:   3 Text Interpretation:  Sinus rhythm Low voltage, precordial leads Abnormal  R-wave progression, early transition No prior EKG for comparison. No  stigmata or arrhythmia or ischemia.  Confirmed by Tyller Bowlby MD, Annabelle Harman [phone]) on  04/22/2016 1:04:46 PM      MDM   Final diagnoses:  Tingling   40 year old female who presents with bilateral forearm tingling and chest wall tingling. Symptoms resolved on arrival. With normal neuro exam and unremarkable cardiopulmonary exam. No  back pain or neck pain. Unclear etiology, but description of symptoms not suggest of CVA or localizable lesion to brain or spinal cord. Basic blood work unremarkable, including trop and electrolytes. Seems atypical for an ACS presentation. EKG without ischemia or stigmata of arrhythmia. At this time not suspecting serious etiology of symptoms. Discussed close outpatient follow-up and return for worsening or progressive symptoms. Strict return and follow-up instructions reviewed. She expressed understanding of all discharge instructions and felt comfortable with the plan of care.    Lavera Guiseana Duo Laydon Martis, MD 04/22/16 2026

## 2016-04-22 NOTE — Discharge Instructions (Signed)
Return for worsening symptoms, including new numbness or weakness, worsening pain, difficulty breathing, passing out, or any other symptoms concerning to you.   Paresthesia Paresthesia is a burning or prickling feeling. This feeling can happen in any part of the body. It often happens in the hands, arms, legs, or feet. Usually, it is not painful. In most cases, the feeling goes away in a short time and is not a sign of a serious problem. HOME CARE  Avoid drinking alcohol.  Try massage or needle therapy (acupuncture) to help with your problems.  Keep all follow-up visits as told by your doctor. This is important. GET HELP IF:  You keep on having episodes of paresthesia.  Your burning or prickling feeling gets worse when you walk.  You have pain or cramps.  You feel dizzy.  You have a rash. GET HELP RIGHT AWAY IF:  You feel weak.  You have trouble walking or moving.  You have problems speaking, understanding, or seeing.  You feel confused.  You cannot control when you pee (urinate) or poop (bowel movement).  You lose feeling (numbness) after an injury.  You pass out (faint).   This information is not intended to replace advice given to you by your health care provider. Make sure you discuss any questions you have with your health care provider.   Document Released: 11/15/2008 Document Revised: 04/19/2015 Document Reviewed: 11/29/2014 Elsevier Interactive Patient Education [employer]2016 [employer]

## 2017-06-10 ENCOUNTER — Ambulatory Visit: Payer: Self-pay | Attending: Family Medicine | Admitting: Family Medicine

## 2017-06-10 VITALS — BP 135/86 | HR 86 | Temp 98.6°F | Resp 18 | Ht 64.0 in | Wt 224.8 lb

## 2017-06-10 DIAGNOSIS — R252 Cramp and spasm: Secondary | ICD-10-CM

## 2017-06-10 DIAGNOSIS — N898 Other specified noninflammatory disorders of vagina: Secondary | ICD-10-CM

## 2017-06-10 DIAGNOSIS — M79662 Pain in left lower leg: Secondary | ICD-10-CM | POA: Insufficient documentation

## 2017-06-10 DIAGNOSIS — E669 Obesity, unspecified: Secondary | ICD-10-CM | POA: Insufficient documentation

## 2017-06-10 DIAGNOSIS — Z23 Encounter for immunization: Secondary | ICD-10-CM

## 2017-06-10 DIAGNOSIS — S161XXA Strain of muscle, fascia and tendon at neck level, initial encounter: Secondary | ICD-10-CM

## 2017-06-10 DIAGNOSIS — I10 Essential (primary) hypertension: Secondary | ICD-10-CM

## 2017-06-10 DIAGNOSIS — Z79899 Other long term (current) drug therapy: Secondary | ICD-10-CM | POA: Insufficient documentation

## 2017-06-10 DIAGNOSIS — Z920 Personal history of contraception: Secondary | ICD-10-CM

## 2017-06-10 LAB — POCT URINE PREGNANCY: Preg Test, Ur: NEGATIVE

## 2017-06-10 LAB — POCT UA - MICROALBUMIN
Albumin/Creatinine Ratio, Urine, POC: <30
CREATININE, POC: 300 mg/dL
Microalbumin Ur, POC: 30 mg/L

## 2017-06-10 MED ORDER — IBUPROFEN 600 MG PO TABS: 600.0000 mg | ORAL_TABLET | Freq: Three times a day (TID) | ORAL | 0 refills | Status: DC | PRN

## 2017-06-10 MED ORDER — AMLODIPINE BESYLATE 2.5 MG PO TABS: 2.5000 mg | ORAL_TABLET | Freq: Every day | ORAL | 2 refills | Status: DC

## 2017-06-10 MED ORDER — CYCLOBENZAPRINE HCL 5 MG PO TABS: 10.0000 mg | ORAL_TABLET | Freq: Three times a day (TID) | ORAL | 0 refills | Status: DC | PRN

## 2017-06-10 NOTE — Progress Notes (Signed)
Subjective:  Patient ID: Theresa Murray, female    DOB: 1976/08/07  Age: 41 y.o. MRN: [id number]  CC: Establish Care   HPI Theresa Murray presents for complains of myalgias for which has been present for 2 week. Pain is located in neck, is described as aching, and is intermittent . She denies any history of injury, decreased ROM, headaches, or ear pain. The patient has tried OTC aleve with minimal relief of symptoms. She also reports greater than 1 month history of leg cramps and pain to left lower leg. Symptoms are intermittent. She denies any swelling, tenderness, or temperature change to the extremities. History of oral contraceptive use for 3 years. She is a non-smoker. Denies any CP or SOB. History of hypertension. She is not exercising and is notadherent to low salt diet. Cardiac symptoms none. Patient denies chest pain, chest pressure/discomfort, claudication, dyspnea, lower extremity edema, near-syncope, palpitations and syncope.  Cardiovascular risk factors: hypertension, obesity (BMI >= 30 kg/m2) and sedentary lifestyle. Use of agents associated with hypertension: oral contraceptives. History of target organ damage: none. Recommended considering alternatives to birth control. She declines at this time.   Outpatient Medications Prior to Visit  Medication Sig Dispense Refill  . fluticasone (FLONASE) 50 MCG/ACT nasal spray 2 spray each nostril qd (Patient taking differently: Place 1 spray into both nostrils daily. ) 16 g 3  . norethindrone-ethinyl estradiol (JUNEL FE,GILDESS FE,LOESTRIN FE) 1-20 MG-MCG tablet Take 1 tablet by mouth daily.    . Multiple Vitamins-Minerals (MULTIVITAMIN WITH MINERALS) tablet Take 1 tablet by mouth daily.    . naproxen sodium (ANAPROX) 220 MG tablet Take 440 mg by mouth daily as needed (headache).    . SUMAtriptan (IMITREX) 100 MG tablet Take 1 at onset of head ache and can repeat in  2 hours if needed. (Patient not taking: Reported on 05/17/2015) 6 tablet 1  .  ibuprofen (ADVIL,MOTRIN) 800 MG tablet Take 1 tablet (800 mg total) by mouth 3 (three) times daily. For knee pain (Patient not taking: Reported on 04/22/2016) 30 tablet 0   No facility-administered medications prior to visit.     ROS Review of Systems  Constitutional: Negative.   HENT: Negative.   Respiratory: Negative.   Cardiovascular: Negative.   Gastrointestinal: Negative.   Musculoskeletal: Positive for myalgias.  Skin: Negative.   Neurological: Negative for dizziness and headaches.    Objective:  BP 135/86 (BP Location: Left Arm, Patient Position: Sitting, Cuff Size: Normal)   Pulse 86   Temp 98.6 F (37 C) (Oral)   Resp 18   Ht 5\' 4"  (1.626 m)   Wt 224 lb 12.8 oz (102 kg)   SpO2 100%   BMI 38.59 kg/m   BP/Weight 06/10/2017 04/22/2016 05/25/2015  Systolic BP 135 150 137  Diastolic BP 86 104 94  Wt. (Lbs) 224.8 - -  BMI 38.59 - -   Physical Exam  Constitutional: She is oriented to person, place, and time. She appears well-developed and well-nourished.  Neck: No JVD present.  Cardiovascular: Normal rate, regular rhythm, normal heart sounds and intact distal pulses.   Pulses:      Radial pulses are 2+ on the right side, and 2+ on the left side.       Dorsalis pedis pulses are 2+ on the right side, and 2+ on the left side.  Pulmonary/Chest: Effort normal and breath sounds normal.  Abdominal: Soft. Bowel sounds are normal.  Musculoskeletal:       Cervical back: She exhibits pain (right  sided lateral ).  LLE decreased ROM with extension.  Neurological: She is alert and oriented to person, place, and time.  Skin: Skin is warm and dry.  Psychiatric: She expresses no homicidal and no suicidal ideation. She expresses no suicidal plans and no homicidal plans.  Nursing note and vitals reviewed.  Assessment & Plan:   Problem List Items Addressed This Visit    None    Visit Diagnoses    Essential hypertension    -  Primary   Schedule BP recheck in 2 weeks with clinic RN.     If BP is greater than 90/60 (MAP 65 or greater) but not less than 130/80 may increase dose     of amlodipine 5 mg QD and recheck in another 2 weeks with clinic RN.    Relevant Medications   amLODipine (NORVASC) 2.5 MG tablet   Other Relevant Orders   Basic metabolic panel   POCT UA - Microalbumin (Completed)   Lipid Panel   Strain of neck muscle, initial encounter       Relevant Medications   cyclobenzaprine (FLEXERIL) 5 MG tablet             ibuprofen (ADVIL,MOTRIN) 600 MG tablet   Leg cramp       Relevant Orders   Basic metabolic panel   CBC   VAS [place] LOWER EXTREMITY VENOUS (DVT)   Vaginal discharge       Relevant Orders   Urine cytology ancillary only   POCT urine pregnancy (Completed)   History of oral contraceptive use          Meds ordered this encounter  Medications  . ibuprofen (ADVIL,MOTRIN) 600 MG tablet    Sig: Take 1 tablet (600 mg total) by mouth every 8 (eight) hours as needed.    Dispense:  30 tablet    Refill:  0    Order Specific Question:   Supervising Provider    Answer:   Quentin Angst [id number]  . cyclobenzaprine (FLEXERIL) 5 MG tablet    Sig: Take 2 tablets (10 mg total) by mouth 3 (three) times daily as needed for muscle spasms.    Dispense:  30 tablet    Refill:  0    Order Specific Question:   Supervising Provider    Answer:   Quentin Angst [id number]  . amLODipine (NORVASC) 2.5 MG tablet    Sig: Take 1 tablet (2.5 mg total) by mouth daily.    Dispense:  30 tablet    Refill:  2    Order Specific Question:   Supervising Provider    Answer:   Quentin Angst [id number]    Follow-up: Return in about 2 weeks (around 06/24/2017) for BP check with clinic RN.   Lizbeth Bark FNP

## 2017-06-10 NOTE — Patient Instructions (Signed)

## 2017-06-10 NOTE — Progress Notes (Signed)
Patient is here for back neck pain that's causing headaches   Left leg pain   PAP smear & physical screening in 2 weeks

## 2017-06-11 LAB — BASIC METABOLIC PANEL
BUN / CREAT RATIO: 19 (ref 9–23)
BUN: 15 mg/dL (ref 6–24)
CALCIUM: 9 mg/dL (ref 8.7–10.2)
CHLORIDE: 107 mmol/L — AB (ref 96–106)
CO2: 19 mmol/L — ABNORMAL LOW (ref 20–29)
Creatinine, Ser: 0.79 mg/dL (ref 0.57–1.00)
GFR calc Af Amer: 108 mL/min/{1.73_m2} (ref >59)
GFR calc non Af Amer: 93 mL/min/{1.73_m2} (ref >59)
GLUCOSE: 67 mg/dL (ref 65–99)
POTASSIUM: 4.4 mmol/L (ref 3.5–5.2)
Sodium: 140 mmol/L (ref 134–144)

## 2017-06-11 LAB — CBC
HEMATOCRIT: 36.9 % (ref 34.0–46.6)
Hemoglobin: 12.3 g/dL (ref 11.1–15.9)
MCH: 25.6 pg — ABNORMAL LOW (ref 26.6–33.0)
MCHC: 33.3 g/dL (ref 31.5–35.7)
MCV: 77 fL — ABNORMAL LOW (ref 79–97)
Platelets: 293 10*3/uL (ref 150–379)
RBC: 4.8 x10E6/uL (ref 3.77–5.28)
RDW: 14.5 % (ref 12.3–15.4)
WBC: 6 10*3/uL (ref 3.4–10.8)

## 2017-06-12 ENCOUNTER — Encounter: Payer: Self-pay | Admitting: Family Medicine

## 2017-06-13 ENCOUNTER — Encounter: Payer: Self-pay | Admitting: Family Medicine

## 2017-06-13 ENCOUNTER — Telehealth: Payer: Self-pay | Admitting: Family Medicine

## 2017-06-13 ENCOUNTER — Other Ambulatory Visit: Payer: Self-pay | Admitting: Family Medicine

## 2017-06-13 DIAGNOSIS — N760 Acute vaginitis: Secondary | ICD-10-CM

## 2017-06-13 DIAGNOSIS — B9689 Other specified bacterial agents as the cause of diseases classified elsewhere: Secondary | ICD-10-CM

## 2017-06-13 LAB — URINE CYTOLOGY ANCILLARY ONLY: CANDIDA VAGINITIS: NEGATIVE

## 2017-06-13 MED ORDER — METRONIDAZOLE 500 MG PO TABS: 500.0000 mg | ORAL_TABLET | Freq: Two times a day (BID) | ORAL | 0 refills | Status: DC

## 2017-06-13 NOTE — Telephone Encounter (Signed)
Pt. Called requesting her urine results. Please advice?

## 2017-06-13 NOTE — Telephone Encounter (Signed)
Labs are available for reporting. Medication sent to pharmacy on file.

## 2017-06-13 NOTE — Telephone Encounter (Signed)
CMA call regarding lab results   Patient Verify DOB   Patient was aware and undertsood  

## 2017-06-13 NOTE — Telephone Encounter (Signed)
Spoke with patient,patient stated that she cant swallow those pills that it makes her sick she is requesting a type of cream.

## 2017-06-13 NOTE — Telephone Encounter (Signed)
Patient is requesting her results from 06/10/17. I am unable to locate a result note to share with the patient. Please FU.

## 2017-06-13 NOTE — Telephone Encounter (Signed)
-----   Message from Lizbeth BarkMandesia R Hairston, FNP sent at 06/13/2017  1:41 PM EDT ----- Bacterial vaginosis was positive. BV is caused by an overgrowth of germs in the vagina. You will be given metronidazole to treat. To reduce your risk of developing BV don't douche, don't use scented soap or sprays, and use protection during sexual intercourse. Yeast is negative Labs that evaluated your blood cells, fluid and electrolyte balance are normal.  Kidney function normal Liver function normal

## 2017-06-13 NOTE — Telephone Encounter (Signed)
Pt. Called requesting to speak with her nurse regarding her urine results. Please f/u with pt.

## 2017-06-14 ENCOUNTER — Telehealth: Payer: Self-pay | Admitting: Family Medicine

## 2017-06-14 ENCOUNTER — Other Ambulatory Visit: Payer: Self-pay | Admitting: Family Medicine

## 2017-06-14 DIAGNOSIS — B9689 Other specified bacterial agents as the cause of diseases classified elsewhere: Secondary | ICD-10-CM

## 2017-06-14 DIAGNOSIS — N760 Acute vaginitis: Secondary | ICD-10-CM

## 2017-06-14 MED ORDER — METRONIDAZOLE 0.75 % VA GEL: 1.0000 | Freq: Every day | VAGINAL | 0 refills | Status: AC

## 2017-06-14 MED FILL — VANDAZOLE VAGINAL 0.75% GEL: 0.75 | 5 days supply | Qty: 70 | Fill #0

## 2017-06-14 NOTE — Telephone Encounter (Signed)
Prescription [facility] to [facility].

## 2017-06-14 NOTE — Telephone Encounter (Signed)
CMA call regarding medication is already sent to our pharmacy is ready to be pick up   Patient did not answer but left a detailed message [facility]

## 2017-06-14 NOTE — Telephone Encounter (Signed)
Pt calling to request that her Flagyl be filled in a cream form and that [facility]. Please f/u.

## 2017-06-24 ENCOUNTER — Other Ambulatory Visit: Payer: PRIVATE HEALTH INSURANCE

## 2017-06-24 ENCOUNTER — Encounter: Payer: PRIVATE HEALTH INSURANCE | Admitting: Pharmacist

## 2017-06-27 ENCOUNTER — Ambulatory Visit (HOSPITAL_COMMUNITY)
Admission: RE | Admit: 2017-06-27 | Discharge: 2017-06-27 | Disposition: A | Discharge status: Home / Self Care | Payer: Self-pay | Source: Ambulatory Visit | Attending: Family Medicine | Admitting: Family Medicine

## 2017-06-27 DIAGNOSIS — R252 Cramp and spasm: Secondary | ICD-10-CM

## 2017-06-27 NOTE — Progress Notes (Signed)
*  PRELIMINARY RESULTS* Vascular Ultrasound Left lower extremity venous duplex has been completed.  Preliminary findings: No evidence of deep vein thrombosis or baker's cysts in the left lower extremity.  Preliminary results called to [facility] at 9:21 and given to [place].   Chauncey FischerCharlotte C Tyrea Froberg 06/27/2017, 9:21 AM

## 2017-06-28 ENCOUNTER — Other Ambulatory Visit: Payer: Self-pay | Admitting: Family Medicine

## 2017-07-01 NOTE — Telephone Encounter (Signed)
CMA call regarding lab results   Patient verify DOB  Patient was aware and understood  

## 2017-07-01 NOTE — Telephone Encounter (Signed)
-----   Message from Lizbeth BarkMandesia R Hairston, FNP sent at 07/01/2017  9:13 AM EDT ----- Negative for blood clot. Negative for baker's cyst.

## 2017-12-26 DIAGNOSIS — Z1151 Encounter for screening for human papillomavirus (HPV): Secondary | ICD-10-CM | POA: Diagnosis not present

## 2017-12-26 DIAGNOSIS — Z1231 Encounter for screening mammogram for malignant neoplasm of breast: Secondary | ICD-10-CM | POA: Diagnosis not present

## 2017-12-26 DIAGNOSIS — Z6834 Body mass index (BMI) 34.0-34.9, adult: Secondary | ICD-10-CM | POA: Diagnosis not present

## 2017-12-26 DIAGNOSIS — Z01419 Encounter for gynecological examination (general) (routine) without abnormal findings: Secondary | ICD-10-CM | POA: Diagnosis not present

## 2018-01-29 DIAGNOSIS — N760 Acute vaginitis: Secondary | ICD-10-CM | POA: Diagnosis not present

## 2018-01-29 DIAGNOSIS — B373 Candidiasis of vulva and vagina: Secondary | ICD-10-CM | POA: Diagnosis not present

## 2018-05-24 DIAGNOSIS — K210 Gastro-esophageal reflux disease with esophagitis: Secondary | ICD-10-CM | POA: Diagnosis not present

## 2018-05-26 ENCOUNTER — Encounter (HOSPITAL_COMMUNITY): Payer: Self-pay | Admitting: Emergency Medicine

## 2018-05-26 ENCOUNTER — Ambulatory Visit (INDEPENDENT_AMBULATORY_CARE_PROVIDER_SITE_OTHER): Payer: BLUE CROSS/BLUE SHIELD

## 2018-05-26 ENCOUNTER — Other Ambulatory Visit: Payer: Self-pay

## 2018-05-26 ENCOUNTER — Ambulatory Visit (HOSPITAL_COMMUNITY)
Admission: EM | Admit: 2018-05-26 | Discharge: 2018-05-26 | Disposition: A | Discharge status: Home / Self Care | Payer: BLUE CROSS/BLUE SHIELD | Attending: Family Medicine | Admitting: Family Medicine

## 2018-05-26 DIAGNOSIS — R0789 Other chest pain: Secondary | ICD-10-CM | POA: Diagnosis not present

## 2018-05-26 DIAGNOSIS — R079 Chest pain, unspecified: Secondary | ICD-10-CM | POA: Diagnosis not present

## 2018-05-26 DIAGNOSIS — M62838 Other muscle spasm: Secondary | ICD-10-CM

## 2018-05-26 MED ORDER — CYCLOBENZAPRINE HCL 10 MG PO TABS: 10.0000 mg | ORAL_TABLET | Freq: Every evening | ORAL | 0 refills | Status: DC | PRN

## 2018-05-26 MED ORDER — NAPROXEN 375 MG PO TABS: 375.0000 mg | ORAL_TABLET | Freq: Two times a day (BID) | ORAL | 0 refills | Status: DC | PRN

## 2018-05-26 NOTE — ED Triage Notes (Signed)
Went to [facility] yesterday for [facility] chest felt "funny".  Pain in middle back yesterday.  Pain started Wednesday, went away and mainly noticed at night.  Denies nausea, vomiting, diaphoresis, sob.  Twisting torso causes upper back pain to worsen, twisting makes chest slightly worse.

## 2018-05-26 NOTE — Discharge Instructions (Addendum)
Limit repetitive overhead and lifting activities.  Avoid upper body weight-strength training exercises until symptoms improve Continue conservative management of rest, ice, heat, and gentle stretches Take naproxen as needed for pain relief (may cause abdominal discomfort, ulcers, and GI bleeds avoid taking with other NSAIDs) Take cyclobenzaprine at nighttime for symptomatic relief. Avoid driving or operating heavy machinery while using medication. Follow up with PCP this week for reevaluation and to ensure improvement of your symptoms Return or go to the ER if you have any new or worsening symptoms

## 2018-05-26 NOTE — ED Provider Notes (Signed)
[facility]   [id number] 05/26/18 Arrival Time: 1105  SUBJECTIVE: History from: patient. Theresa Murray is a 42 y.o. female complains of stable intermittent chest wall discomfort that began 5 days ago.  Denies a precipitating event or specific injury.  Localizes the pain to the anterior and posterior chest.  Describes the pain as intermittent, lasting a 2-3 minutes, and achy in character.  Has tried aleve with relief.  Symptoms are made worse with reaching behind.  Admits similar symptoms in the past.  Denies fever, chills, chest pain, SOB, nausea, HA, vision changes, dizziness, erythema, ecchymosis, effusion, weakness, numbness and tingling.      Was seen at [facility] 2 days ago and prescribed nexium for heart burn.  Has not tried this medication yet.    Currently on birth control, former smoker, no significant cardiac hx in the family, no calf pain, or prolonged sedentary activity.    ROS: As per HPI.  Past Medical History:  Diagnosis Date  . Anemia   . Anxiety    h/o anxiety- related to GERD- thought her heart  was "acting" up  . Endometriosis   . GERD (gastroesophageal reflux disease)    no meds  . Headache(784.0)   . Heart murmur    h/o murmur- no probs  . History of nuclear stress test    normal 2006 nl EF  . Shortness of breath    when climbing stairs   Past Surgical History:  Procedure Laterality Date  . LAPAROSCOPIC OVARIAN CYSTECTOMY     right endometriosis    No Known Allergies No current facility-administered medications on file prior to encounter.    Current Outpatient Medications on File Prior to Encounter  Medication Sig Dispense Refill  . amLODipine (NORVASC) 2.5 MG tablet Take 1 tablet (2.5 mg total) by mouth daily. 30 tablet 2  . cyclobenzaprine (FLEXERIL) 5 MG tablet Take 2 tablets (10 mg total) by mouth 3 (three) times daily as needed for muscle spasms. 30 tablet 0  . fluticasone (FLONASE) 50 MCG/ACT nasal spray 2 spray each nostril qd  (Patient taking differently: Place 1 spray into both nostrils daily. ) 16 g 3  . ibuprofen (ADVIL,MOTRIN) 600 MG tablet Take 1 tablet (600 mg total) by mouth every 8 (eight) hours as needed. 30 tablet 0  . metroNIDAZOLE (FLAGYL) 500 MG tablet Take 1 tablet (500 mg total) by mouth 2 (two) times daily. 14 tablet 0  . Multiple Vitamins-Minerals (MULTIVITAMIN WITH MINERALS) tablet Take 1 tablet by mouth daily.    . naproxen sodium (ANAPROX) 220 MG tablet Take 440 mg by mouth daily as needed (headache).    . norethindrone-ethinyl estradiol (JUNEL FE,GILDESS FE,LOESTRIN FE) 1-20 MG-MCG tablet Take 1 tablet by mouth daily.    . SUMAtriptan (IMITREX) 100 MG tablet Take 1 at onset of head ache and can repeat in  2 hours if needed. (Patient not taking: Reported on 05/17/2015) 6 tablet 1   Social History   Socioeconomic History  . Marital status: Single    Spouse name: Not on file  . Number of children: Not on file  . Years of education: Not on file  . Highest education level: Not on file  Occupational History  . Not on file  Social Needs  . Financial resource strain: Not on file  . Food insecurity:    Worry: Not on file    Inability: Not on file  . Transportation needs:    Medical: Not on file    Non-medical:  Not on file  Tobacco Use  . Smoking status: Former Smoker  Substance and Sexual Activity  . Alcohol use: Yes    Alcohol/week: 1.8 oz    Types: 3 Glasses of wine per week    Comment: socially  . Drug use: No  . Sexual activity: Not on file  Lifestyle  . Physical activity:    Days per week: Not on file    Minutes per session: Not on file  . Stress: Not on file  Relationships  . Social connections:    Talks on phone: Not on file    Gets together: Not on file    Attends religious service: Not on file    Active member of club or organization: Not on file    Attends meetings of clubs or organizations: Not on file    Relationship status: Not on file  . Intimate partner violence:     Fear of current or ex partner: Not on file    Emotionally abused: Not on file    Physically abused: Not on file    Forced sexual activity: Not on file  Other Topics Concern  . 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               Family History  Problem Relation Age of Onset  . Hypertension Mother   . Kidney disease Mother        tumor on kidney, removed kidney  . Hypertension Maternal Aunt   . Cancer Maternal Grandfather        lung  . Diabetes Maternal Aunt   . Lung cancer Unknown     OBJECTIVE:  Vitals:   05/26/18 1215  BP: 134/82  Pulse: 83  Resp: 20  Temp: 98.8 F (37.1 C)  TempSrc: Oral  SpO2: 100%    General appearance: AOx3; in no acute distress.  Head: NCAT Lungs: CTA bilaterally Heart: RRR.  Clear S1 and S2 without murmur, gallops, or rubs.  Radial pulses 2+ bilaterally. Musculoskeletal: Chest  Inspection: Skin warm, dry, clear and intact without obvious  erythema, effusion, ecchymosis, or deformity Palpation: guarding with anterior chest palpation, without obvious  heaves, lifts or thrills.  Mildly tender about the right thoracic.   paravertebral  muscles ROM: FROM active and passive Skin: warm and dry Neurologic: Ambulates without difficulty Psychological: alert and cooperative; normal mood and affect  DIAGNOSTIC STUDIES:  CLINICAL DATA: Intermittent chest pain for 3 days.  EXAM: CHEST - 2 VIEW  COMPARISON: 04/22/2016 radiographs.  FINDINGS: The heart size and mediastinal contours are normal. The lungs are clear. There is no pleural effusion or pneumothorax. No acute osseous findings are identified. There is a stable convex right scoliosis.  IMPRESSION: No active cardiopulmonary process. Scoliosis.   Electronically Signed By: Carey Bullocks M.D. On: 05/26/2018  13:00  I have reviewed the x-rays myself and the radiologist interpretation. I am in agreement with the radiologist interpretation.    EKG:   Normal sinus arrhythmia.  No obvious ST depressions, elevations, PR interval elongation, widened or narrow QRS complexes.  Comparable with previous EKG.  Review EKG with Dr. Delton See.    MDM:  Discussed patient case with Dr. Delton See.  Patient's hx, symptoms, PE, and  diagnostic studies are most consistent with musculoskeletal injury.  EKG and CXR were unremarkable.  Will treated conservatively and have her follow up with PCP for reevaluation in the near future.  Strict return and ER precautions given.   ASSESSMENT & PLAN:  1. Chest wall pain   2. Muscle spasm     @NIMG @  No orders of the defined types were placed in this encounter.  Limit repetitive overhead and lifting activities.  Avoid upper body weight-strength training exercises until symptoms improve Continue conservative management of rest, ice, heat, and gentle stretches Take naproxen as needed for pain relief (may cause abdominal discomfort, ulcers, and GI bleeds avoid taking with other NSAIDs) Take cyclobenzaprine at nighttime for symptomatic relief. Avoid driving or operating heavy machinery while using medication. Follow up with PCP this week for reevaluation and to ensure improvement of your symptoms Return or go to the ER if you have any new or worsening symptoms   Reviewed expectations re: course of current medical issues. Questions answered. Outlined signs and symptoms indicating need for more acute intervention. Patient verbalized understanding. After Visit Summary given.    Rennis HardingWurst, Vanette Noguchi, PA-C 05/26/18 1316

## 2018-07-27 IMAGING — DX DG CHEST 2V
2 series · 2 of 2 positions shown · non-contrast
Comparison: 04/22/2016 radiographs.

CLINICAL DATA: Intermittent chest pain for 3 days.

EXAM:
CHEST - 2 VIEW

[chest pa]
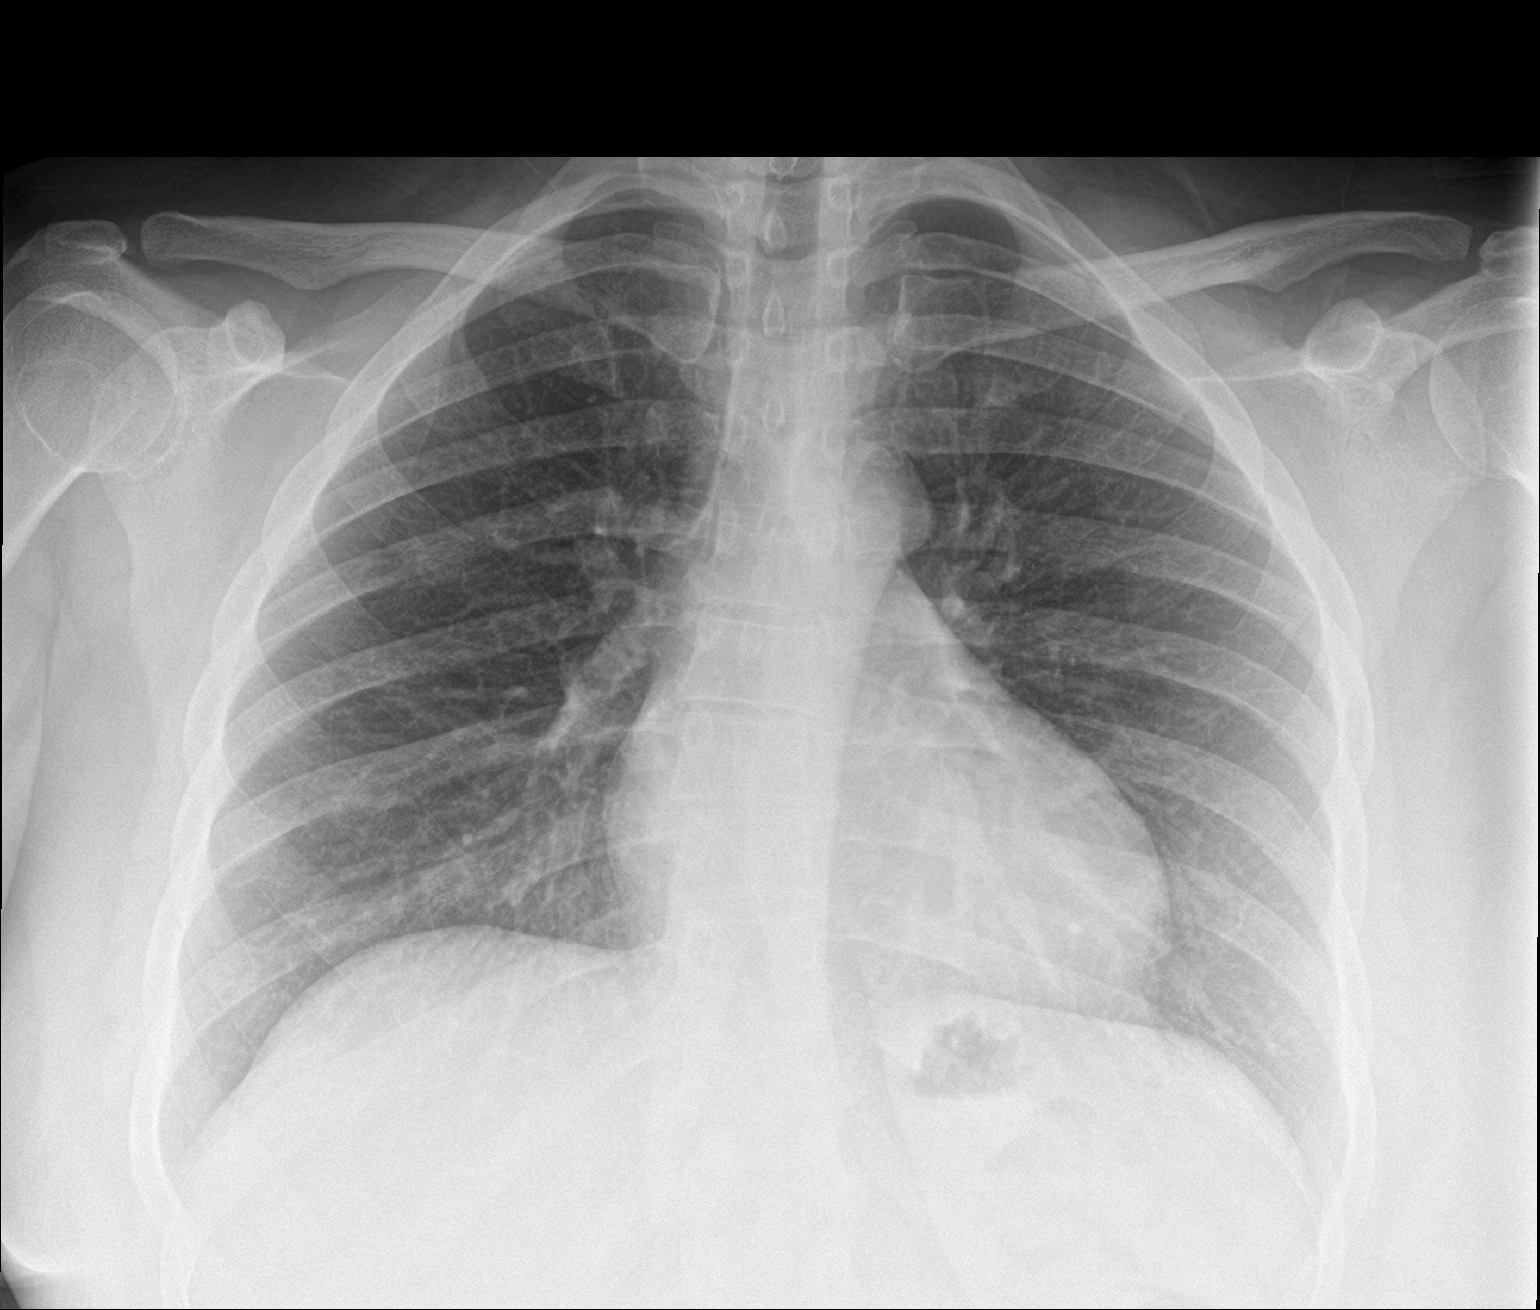

[chest lat]
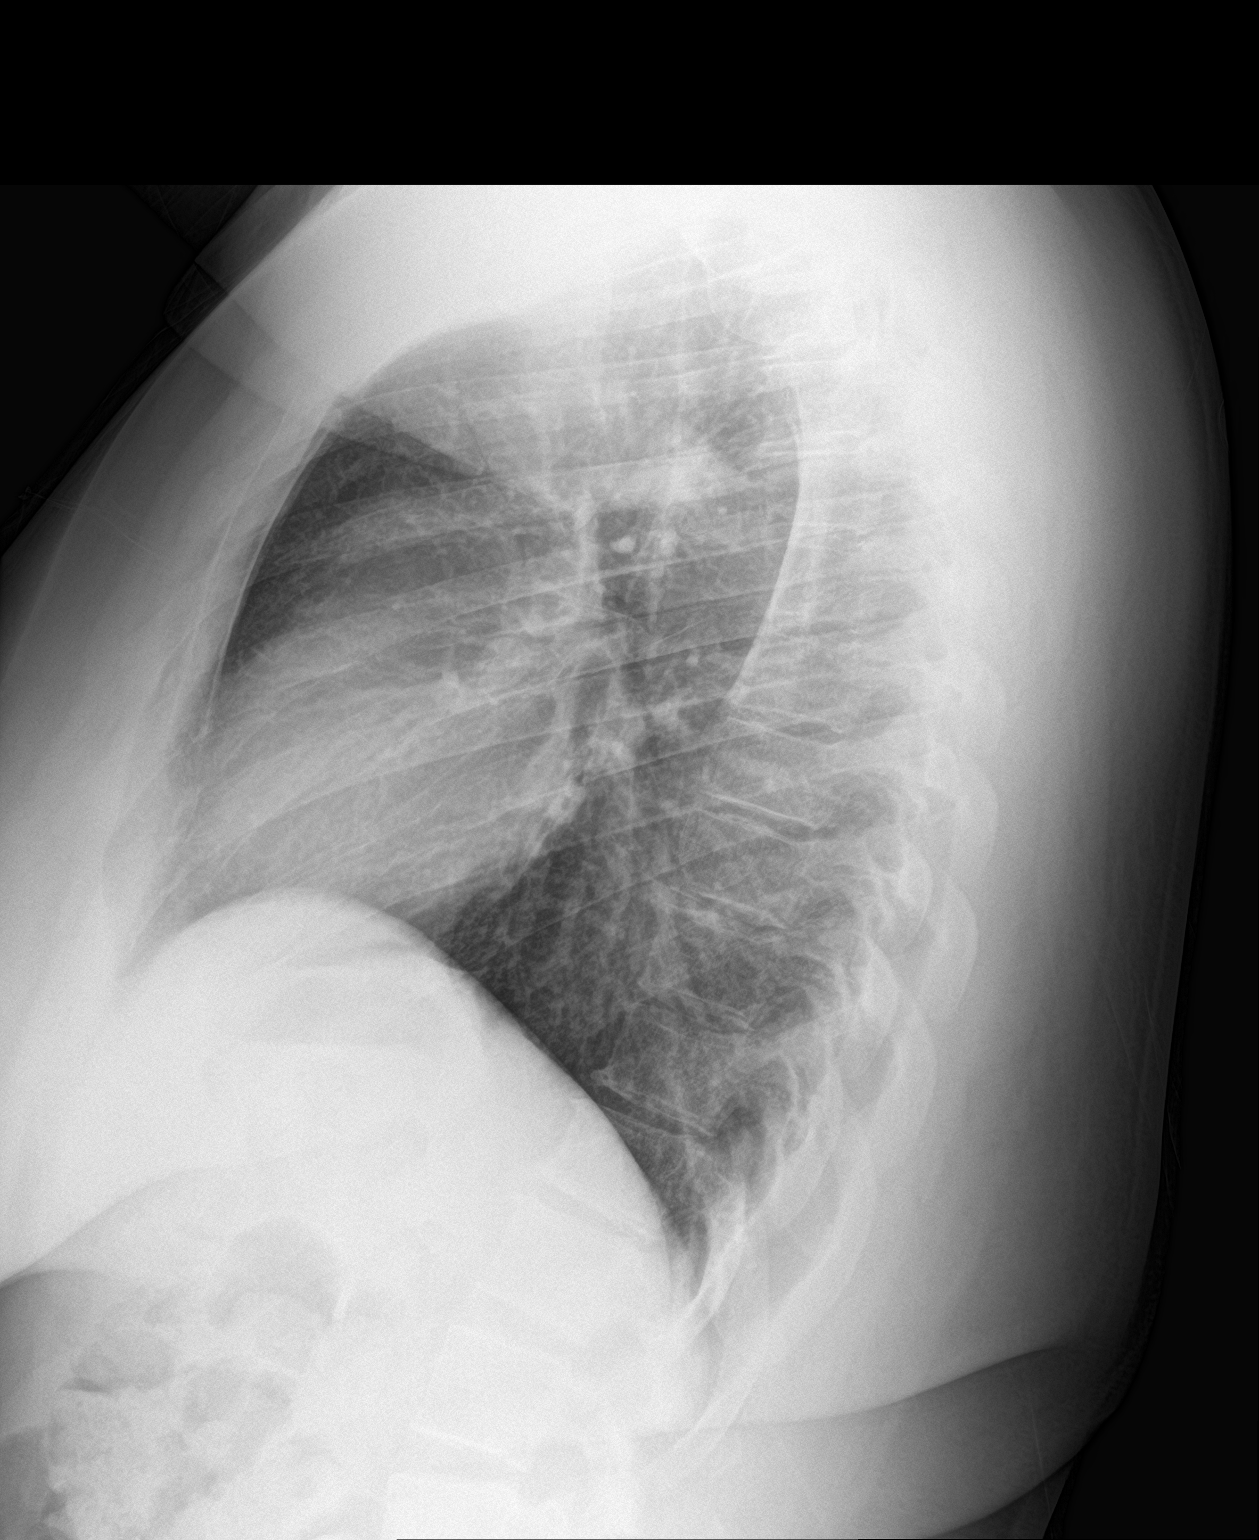

[2 of 2 positions shown; findings below may reference images not displayed]

FINDINGS: The heart size and mediastinal contours are normal. The lungs are
clear. There is no pleural effusion or pneumothorax. No acute
osseous findings are identified. There is a stable convex right
scoliosis.
IMPRESSION: No active cardiopulmonary process.  Scoliosis.

## 2019-01-31 ENCOUNTER — Encounter (HOSPITAL_COMMUNITY): Payer: Self-pay | Admitting: Emergency Medicine

## 2019-01-31 ENCOUNTER — Ambulatory Visit (HOSPITAL_COMMUNITY)
Admission: EM | Admit: 2019-01-31 | Discharge: 2019-01-31 | Disposition: A | Discharge status: Home / Self Care | Payer: BLUE CROSS/BLUE SHIELD | Attending: Family Medicine | Admitting: Family Medicine

## 2019-01-31 DIAGNOSIS — S161XXA Strain of muscle, fascia and tendon at neck level, initial encounter: Secondary | ICD-10-CM

## 2019-01-31 DIAGNOSIS — S76912A Strain of unspecified muscles, fascia and tendons at thigh level, left thigh, initial encounter: Secondary | ICD-10-CM | POA: Diagnosis not present

## 2019-01-31 MED ORDER — CYCLOBENZAPRINE HCL 10 MG PO TABS: 10.0000 mg | ORAL_TABLET | Freq: Every evening | ORAL | 0 refills | Status: DC | PRN

## 2019-01-31 MED ORDER — NAPROXEN 375 MG PO TABS: 375.0000 mg | ORAL_TABLET | Freq: Two times a day (BID) | ORAL | 0 refills | Status: DC | PRN
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
## 2019-01-31 NOTE — Discharge Instructions (Signed)
Rest, ice and heat as needed Ensure adequate ROM as tolerated. Injuries all appear to be muscular in nature. Prescribed naproxen as needed for inflammation and pain relief.  DO NOT TAKE WITH OTHER ANTIINFLAMMATORIES as this may cause GI upset and possible GI bleed Prescribed flexeril as needed at bedtime for muscle spasm.  Do not drive or operate heavy machinery while taking this medication Expect some increased pain in the next 1-3 days.  It may take 3-4 weeks for complete resolution of symptoms Will f/u with her doctor or here if not seeing significant improvement within one week. Return here or go to ER if you have any new or worsening symptoms such as numbness/tingling of the inner thighs, loss of bladder or bowel control, headache/blurry vision, nausea/vomiting, confusion/altered mental status, dizziness, weakness, passing out, imbalance, etc..Marland Kitchen

## 2020-08-16 ENCOUNTER — Encounter: Payer: Self-pay | Admitting: Gastroenterology

## 2020-08-16 ENCOUNTER — Ambulatory Visit (INDEPENDENT_AMBULATORY_CARE_PROVIDER_SITE_OTHER): Payer: 59 | Admitting: Gastroenterology

## 2020-08-16 VITALS — BP 138/92 | HR 89 | Ht 65.75 in | Wt 228.0 lb

## 2020-08-16 DIAGNOSIS — K625 Hemorrhage of anus and rectum: Secondary | ICD-10-CM | POA: Diagnosis not present

## 2020-08-16 DIAGNOSIS — K5900 Constipation, unspecified: Secondary | ICD-10-CM

## 2020-08-16 MED ORDER — NA SULFATE-K SULFATE-MG SULF 17.5-3.13-1.6 GM/177ML PO SOLN: 1.0000 | Freq: Once | ORAL | 0 refills | Status: AC

## 2020-08-16 NOTE — Patient Instructions (Addendum)
If you are age 44 or older, your body mass index should be between 23-30. Your Body mass index is 37.08 kg/m. If this is out of the aforementioned range listed, please consider follow up with your Primary Care Provider.  If you are age 67 or younger, your body mass index should be between 19-25. Your Body mass index is 37.08 kg/m. If this is out of the aformentioned range listed, please consider follow up with your Primary Care Provider.   You have been scheduled for a colonoscopy. Please follow written instructions given to you at your visit today.  Please pick up your prep supplies at the pharmacy within the next 1-3 days. If you use inhalers (even only as needed), please bring them with you on the day of your procedure.  Follow up pending the results of your Colonoscopy.

## 2020-08-16 NOTE — Progress Notes (Signed)
Reviewed and agree with management plan.  Kyria Bumgardner T. Amori Colomb, MD FACG [facility]  

## 2020-08-16 NOTE — Progress Notes (Addendum)
08/16/2020 Theresa Murray [id number] 03-04-1976   HISTORY OF PRESENT ILLNESS: This is a 44 year old female who is known to Dr. Russella Dar for an office visit about 10 years ago.  She presents here today with complaints of rectal bleeding and constipation.  She says that overall she moves her bowels at least twice a week without straining or hard stools as long as she eats a lot of dietary fiber such as apricots, etc.  About a month ago she had an episode of bright red blood on the toilet paper upon wiping.  She says that she feels like she has a lot of gas in the morning, but no significant abdominal pains.  She is interested in colonoscopy.  Referred here by Ralene Ok, PA-C, for blood in stool.  Labs including CBC and TSH in March WNLs.   Past Medical History:  Diagnosis Date  . Anemia   . Anxiety    h/o anxiety- related to GERD- thought her heart  was "acting" up  . Endometriosis   . GERD (gastroesophageal reflux disease)    no meds  . Headache(784.0)   . Heart murmur    h/o murmur- no probs  . History of nuclear stress test    normal 2006 nl EF  . Shortness of breath    when climbing stairs   Past Surgical History:  Procedure Laterality Date  . LAPAROSCOPIC OVARIAN CYSTECTOMY     right endometriosis     reports that she has quit smoking. She has never used smokeless tobacco. She reports current alcohol use of about 3.0 standard drinks of alcohol per week. She reports that she does not use drugs. family history includes Cancer in her maternal grandfather; Diabetes in her maternal aunt; Hypertension in her maternal aunt and mother; Kidney disease in her mother; Lung cancer in an other family member. No Known Allergies    Outpatient Encounter Medications as of 08/16/2020  Medication Sig  . amLODipine (NORVASC) 2.5 MG tablet Take 1 tablet (2.5 mg total) by mouth daily.  . fluticasone (FLONASE) 50 MCG/ACT nasal spray 2 spray each nostril qd (Patient taking differently: Place 1  spray into both nostrils daily. )  . Multiple Vitamins-Minerals (MULTIVITAMIN WITH MINERALS) tablet Take 1 tablet by mouth daily.  . naproxen (NAPROSYN) 375 MG tablet Take 1 tablet (375 mg total) by mouth 2 (two) times daily as needed for mild pain or moderate pain.  . [DISCONTINUED] cyclobenzaprine (FLEXERIL) 10 MG tablet Take 1 tablet (10 mg total) by mouth at bedtime as needed for muscle spasms.  . [DISCONTINUED] norethindrone-ethinyl estradiol (JUNEL FE,GILDESS FE,LOESTRIN FE) 1-20 MG-MCG tablet Take 1 tablet by mouth daily.   No facility-administered encounter medications on file as of 08/16/2020.    REVIEW OF SYSTEMS  : All other systems reviewed and negative except where noted in the History of Present Illness.   PHYSICAL EXAM: BP (!) 138/92 (BP Location: Left Arm, Patient Position: Sitting, Cuff Size: Normal)   Pulse 89   Ht 5' 5.75" (1.67 m)   Wt 228 lb (103.4 kg)   BMI 37.08 kg/m  General: Well developed AA female in no acute distress Head: Normocephalic and atraumatic Eyes:  Sclerae anicteric, conjunctiva pink. Ears: Normal auditory acuity  Lungs: Clear throughout to auscultation; no W/R/R. Heart: Regular rate and rhythm; no M/R/G. Abdomen: Soft, non-distended.  BS present.  Non-tender. Rectal:  Will be done at the time of colonoscopy. Musculoskeletal: Symmetrical with no gross deformities  Skin: No lesions on  visible extremities Extremities: No edema  Neurological: Alert oriented x 4, grossly non-focal Psychological:  Alert and cooperative. Normal mood and affect  ASSESSMENT AND PLAN: *44 year old female with complaints of an episode of rectal bleeding and constipation: Overall moves her bowels at least twice a week with the help of dietary fiber, etc.  We also discussed the role of MiraLAX.  She saw bright red blood on the toilet paper on 1 occasion about a month ago.  Would like to have colonoscopy.  We will plan for this with Dr. Russella Dar as he saw the patient about 10  years ago in clinic.  The risks, benefits, and alternatives to colonoscopy were discussed with the patient and she consents to proceed.    CC:  Jordan Hawks, PA-C

## 2020-10-06 ENCOUNTER — Encounter: Payer: Self-pay | Admitting: Gastroenterology

## 2020-10-06 ENCOUNTER — Other Ambulatory Visit: Payer: Self-pay

## 2020-10-06 ENCOUNTER — Ambulatory Visit (AMBULATORY_SURGERY_CENTER): Payer: 59 | Admitting: Gastroenterology

## 2020-10-06 VITALS — BP 137/86 | HR 68 | Temp 97.0°F | Resp 21 | Ht 65.0 in | Wt 228.0 lb

## 2020-10-06 DIAGNOSIS — K648 Other hemorrhoids: Secondary | ICD-10-CM

## 2020-10-06 DIAGNOSIS — K921 Melena: Secondary | ICD-10-CM

## 2020-10-06 MED ORDER — SODIUM CHLORIDE 0.9 % IV SOLN: 500.0000 mL | Freq: Once | INTRAVENOUS | Status: DC

## 2020-10-06 MED ORDER — FLEET ENEMA 7-19 GM/118ML RE ENEM
1.0000 | ENEMA | Freq: Once | RECTAL | Status: AC
  Administered 2020-10-06: 1 via RECTAL

## 2020-10-06 NOTE — Op Note (Addendum)
[facility] Patient Name: Theresa Murray Procedure Date: 10/06/2020 11:26 AM MRN: [id number] Endoscopist: Meryl Dare , MD Age: 44 Referring MD:  Date of Birth: July 20, 1976 Gender: Female Account #: [id number] Procedure:                Colonoscopy Indications:              Hematochezia Medicines:                Monitored Anesthesia Care Procedure:                Pre-Anesthesia Assessment:                           - Prior to the procedure, a History and Physical                            was performed, and patient medications and                            allergies were reviewed. The patient's tolerance of                            previous anesthesia was also reviewed. The risks                            and benefits of the procedure and the sedation                            options and risks were discussed with the patient.                            All questions were answered, and informed consent                            was obtained. Prior Anticoagulants: The patient has                            taken no previous anticoagulant or antiplatelet                            agents. ASA Grade Assessment: II - A patient with                            mild systemic disease. After reviewing the risks                            and benefits, the patient was deemed in                            satisfactory condition to undergo the procedure.                           After obtaining informed consent, the colonoscope  was passed under direct vision. Throughout the                            procedure, the patient's blood pressure, pulse, and                            oxygen saturations were monitored continuously. The                            Colonoscope was introduced through the anus and                            advanced to the the cecum, identified by                            appendiceal orifice and ileocecal valve. The                             ileocecal valve, appendiceal orifice, and rectum                            were photographed. The quality of the bowel                            preparation was adequate after extensive lavage and                            suction. The colonoscopy was performed without                            difficulty. The patient tolerated the procedure                            well. Scope In: 11:30:17 AM Scope Out: 11:49:56 AM Total Procedure Duration: 0 hours 19 minutes 39 seconds  Findings:                 The perianal and digital rectal examinations were                            normal.                           Internal hemorrhoids were found during                            retroflexion. The hemorrhoids were small and Grade                            I (internal hemorrhoids that do not prolapse).                           The exam was otherwise without abnormality on  direct and retroflexion views. Complications:            No immediate complications. Estimated blood loss:                            None. Estimated Blood Loss:     Estimated blood loss: none. Impression:               - Internal hemorrhoids.                           - The examination was otherwise normal on direct                            and retroflexion views.                           - No specimens collected. Recommendation:           - Repeat colonoscopy in 10 years for screening                            purposes.                           - Patient has a contact number available for                            emergencies. The signs and symptoms of potential                            delayed complications were discussed with the                            patient. Return to normal activities tomorrow.                            Written discharge instructions were provided to the                            patient.                           - Resume previous diet.                            - Continue present medications. Meryl Dare, MD 10/06/2020 11:53:40 AM This report has been signed electronically.

## 2020-10-06 NOTE — Progress Notes (Signed)
Pt Drowsy. VSS. To PACU, report to RN. No anesthetic complications noted.  

## 2020-10-06 NOTE — Progress Notes (Signed)
Monitor picked up heart rate of 120-146 but then would drop down into the 80's.  Patient stated she didn't feel like her heart rate was high.  I manually checked heart rate for a full minute and it was 86.

## 2020-10-06 NOTE — Patient Instructions (Signed)
HANDOUTS PROVIDED ON: HEMORRHOIDS  You may resume your previous diet and medication schedule.  Thank you for allowing us to care for you today!!!   YOU HAD AN ENDOSCOPIC PROCEDURE TODAY AT THE [facility]:   Refer to the procedure report that was given to you for any specific questions about what was found during the examination.  If the procedure report does not answer your questions, please call your gastroenterologist to clarify.  If you requested that your care partner not be given the details of your procedure findings, then the procedure report has been included in a sealed envelope for you to review at your convenience later.  YOU SHOULD EXPECT: Some feelings of bloating in the abdomen. Passage of more gas than usual.  Walking can help get rid of the air that was put into your GI tract during the procedure and reduce the bloating. If you had a lower endoscopy (such as a colonoscopy or flexible sigmoidoscopy) you may notice spotting of blood in your stool or on the toilet paper. If you underwent a bowel prep for your procedure, you may not have a normal bowel movement for a few days.  Please Note:  You might notice some irritation and congestion in your nose or some drainage.  This is from the oxygen used during your procedure.  There is no need for concern and it should clear up in a day or so.  SYMPTOMS TO REPORT IMMEDIATELY:   Following lower endoscopy (colonoscopy or flexible sigmoidoscopy):  Excessive amounts of blood in the stool  Significant tenderness or worsening of abdominal pains  Swelling of the abdomen that is new, acute  Fever of 100F or higher  For urgent or emergent issues, a gastroenterologist can be reached at any hour by calling [phone]. Do not use MyChart messaging for urgent concerns.    DIET:  We do recommend a small meal at first, but then you may proceed to your regular diet.  Drink plenty of fluids but you should avoid alcoholic beverages  for 24 hours.  ACTIVITY:  You should plan to take it easy for the rest of today and you should NOT DRIVE or use heavy machinery until tomorrow (because of the sedation medicines used during the test).    FOLLOW UP: Our staff will call the number listed on your records 48-72 hours following your procedure to check on you and address any questions or concerns that you may have regarding the information given to you following your procedure. If we do not reach you, we will leave a message.  We will attempt to reach you two times.  During this call, we will ask if you have developed any symptoms of COVID 19. If you develop any symptoms (ie: fever, flu-like symptoms, shortness of breath, cough etc.) before then, please call [phone].  If you test positive for Covid 19 in the 2 weeks post procedure, please call and report this information to us.    If any biopsies were taken you will be contacted by phone or by letter within the next 1-3 weeks.  Please call us at [phone] if you have not heard about the biopsies in 3 weeks.    SIGNATURES/CONFIDENTIALITY: You and/or your care partner have signed paperwork which will be entered into your electronic medical record.  These signatures attest to the fact that that the information above on your After Visit Summary has been reviewed and is understood.  Full responsibility of the confidentiality of this   discharge information lies with you and/or your care-partner. 

## 2020-10-10 ENCOUNTER — Telehealth: Payer: Self-pay | Admitting: *Deleted

## 2020-10-10 NOTE — Telephone Encounter (Signed)
°  Follow up Call-  Call back number 10/06/2020  Post procedure Call Back phone  # [phone]  Permission to leave phone message Yes  Some recent data might be hidden     Patient questions:  Do you have a fever, pain , or abdominal swelling? No. Pain Score  0 *  Have you tolerated food without any problems? Yes.  Have you been able to return to your normal activities? Yes.    Do you have any questions about your discharge instructions: Diet   No. Medications  No. Follow up visit  No.  Do you have questions or concerns about your Care? No.  Actions: * If pain score is 4 or above: No action needed, pain <4.  1. Have you developed a fever since your procedure? no  2.   Have you had an respiratory symptoms (SOB or cough) since your procedure? no  3.   Have you tested positive for COVID 19 since your procedure no  4.   Have you had any family members/close contacts diagnosed with the COVID 19 since your procedure?  no   If yes to any of these questions please route to Laverna Peace, RN and Karlton Lemon, RN

## 2022-08-09 ENCOUNTER — Ambulatory Visit: Payer: Self-pay | Admitting: Gastroenterology

## 2022-09-14 ENCOUNTER — Ambulatory Visit: Payer: Self-pay | Admitting: Physician Assistant

## 2022-10-09 ENCOUNTER — Ambulatory Visit (INDEPENDENT_AMBULATORY_CARE_PROVIDER_SITE_OTHER): Payer: 59 | Admitting: Physician Assistant

## 2022-10-09 ENCOUNTER — Encounter: Payer: Self-pay | Admitting: Physician Assistant

## 2022-10-09 VITALS — BP 138/80 | HR 72 | Ht 66.0 in | Wt 220.0 lb

## 2022-10-09 DIAGNOSIS — R142 Eructation: Secondary | ICD-10-CM

## 2022-10-09 DIAGNOSIS — R141 Gas pain: Secondary | ICD-10-CM

## 2022-10-09 DIAGNOSIS — R143 Flatulence: Secondary | ICD-10-CM | POA: Diagnosis not present

## 2022-10-09 DIAGNOSIS — K5900 Constipation, unspecified: Secondary | ICD-10-CM | POA: Diagnosis not present

## 2022-10-09 DIAGNOSIS — R87615 Unsatisfactory cytologic smear of cervix: Secondary | ICD-10-CM | POA: Diagnosis not present

## 2022-10-09 NOTE — Patient Instructions (Signed)
You have been given a testing kit to check for small intestine bacterial overgrowth (SIBO) which is completed by a company named [facility]. Make sure to return your test in the mail using the return mailing label given to you along with the kit. Your demographic and insurance information have already been sent to the company and they should be in contact with you over the next 1-2 weeks regarding this test. [facility] will collect an upfront charge of $99.74 for commercial insurance plans and $209.74 is you are paying cash. Make sure to discuss with [facility] PRIOR to having the test to see if they have gotten information from your insurance company as to how much your testing will cost out of pocket, if any. Please keep in mind that you will be getting a call from phone number [phone] or a similar number. If you do not hear from them within this time frame, please call our office at [phone] or call [facility] directly at [phone].    Try Citrucel fiber supplement instead of your prunes.   I appreciate the opportunity to care for you. Ellouise Newer, PA-C

## 2022-10-09 NOTE — Progress Notes (Signed)
Chief Complaint: Constipation  HPI:    Theresa Murray is a 46 year old female with a past medical history of anxiety, reflux and others listed below, known to Dr. Fuller Plan, who was referred to me by Mindi Curling, PA-C for a complaint of constipation.      08/16/2020 office visit with Alonza Bogus, PA for constipation and rectal bleeding.  At that time was moving her bowels at least twice a week with help with dietary fiber.  MiraLAX was discussed.  Recommended colonoscopy given bleeding.    10/06/2020 colonoscopy for hematochezia with internal hemorrhoids and otherwise normal.  Repeat recommended in 10 years.    08/08/2022 CBC with decreased MCV.  Hemoglobin A1c normal.    Today, the patient tells me that she is very "gassy", she feels like she gets trapped gas a lot especially on the left side of her abdomen when she is at work sitting down or even just throughout the day and when she wakes up in the morning she has to pass a lot of gas.  Tells me that she has a bowel movement 2-3 times a week with the assistance of prune juice, prunes and apricots daily around lunchtime.  Tells me she tried MiraLAX in the past and this was not helpful.  She feels like she gets a good bowel movement though and does not have any nausea or other symptoms.  Just really wondering if the amount of gas she has is too much.    Denies fever, chills, weight loss or blood in her stool.  Past Medical History:  Diagnosis Date   Allergy    Anemia    Anxiety    h/o anxiety- related to GERD- thought her heart  was "acting" up   Endometriosis    GERD (gastroesophageal reflux disease)    no meds   Headache(784.0)    Heart murmur    h/o murmur- no probs   History of nuclear stress test    normal 2006 nl EF   Shortness of breath    when climbing stairs    Past Surgical History:  Procedure Laterality Date   LAPAROSCOPIC OVARIAN CYSTECTOMY     right endometriosis     Current Outpatient Medications  Medication Sig  Dispense Refill   fluticasone (FLONASE) 50 MCG/ACT nasal spray 2 spray each nostril qd (Patient taking differently: Place 1 spray into both nostrils daily. ) 16 g 3   INCASSIA 0.35 MG tablet Take 1 tablet by mouth daily.     Multiple Vitamins-Minerals (MULTIVITAMIN WITH MINERALS) tablet Take 1 tablet by mouth daily.     No current facility-administered medications for this visit.    Allergies as of 10/09/2022   (No Known Allergies)    Family History  Problem Relation Age of Onset   Hypertension Mother    Kidney disease Mother        tumor on kidney, removed kidney   Hypertension Maternal Aunt    Cancer Maternal Grandfather        lung   Diabetes Maternal Aunt    Lung cancer Other    Colon cancer Neg Hx    Esophageal cancer Neg Hx    Rectal cancer Neg Hx    Stomach cancer Neg Hx     Social History   Socioeconomic History   Marital status: Single    Spouse name: Not on file   Number of children: Not on file   Years of education: Not on file   Highest education  level: Not on file  Occupational History   Not on file  Tobacco Use   Smoking status: Former   Smokeless tobacco: Never  Vaping Use   Vaping Use: Never used  Substance and Sexual Activity   Alcohol use: Yes    Alcohol/week: 3.0 standard drinks of alcohol    Types: 3 Glasses of wine per week    Comment: socially   Drug use: No   Sexual activity: Not Currently  Other Topics Concern   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               Social Determinants of Health   Financial Resource Strain: Not on file  Food Insecurity: Not on file  Transportation Needs: Not on file  Physical Activity: Not on file  Stress: Not on file  Social Connections: Not on file  Intimate Partner Violence: Not on file    Review of Systems:     Constitutional: No weight loss, fever or chills Cardiovascular: No chest pain Respiratory: No SOB  Gastrointestinal: See HPI and otherwise negative   Physical Exam:  Vital signs: BP 138/80   Pulse 72   Ht 5\' 6"  (1.676 m)   Wt 220 lb (99.8 kg)   BMI 35.51 kg/m    Constitutional:   Pleasant overweight AA female appears to be in NAD, Well developed, Well nourished, alert and cooperative Respiratory: Respirations even and unlabored. Lungs clear to auscultation bilaterally.   No wheezes, crackles, or rhonchi.  Cardiovascular: Normal S1, S2. No MRG. Regular rate and rhythm. No peripheral edema, cyanosis or pallor.  Gastrointestinal:  Soft, nondistended, nontender. No rebound or guarding. Increased BS. No appreciable masses or hepatomegaly. Rectal:  Not performed.  Psychiatric: Oriented to person, place and time. Demonstrates good judgement and reason without abnormal affect or behaviors.  See HPI for recent labs.  Assessment: 1.  Constipation: For at least the past 13 to 15 years though patient does not really remember this, likely her trapped gas feeling is related to her slowed bowel movements; consider IBS-C +/- SIBO 2.  Gas pain: Likely with above 3.  Gas/bloating  Plan: 1.  Discussed constipation,gas and splenic flexure syndrome.  Answered questions. 2.  Discussed possibly the prunes/prune juice/apricots she is eating is adding to the gas in her system.  Would recommend that she change her fiber supplementation to Citrucel which is less gas causing.  She should start with once daily dosing and increase to twice daily if needed. 3.  We will also test for SIBO with a breath test. 4.  Patient to follow in clinic in 4 to 6 weeks.  At that point can discuss results and how she is doing with the Citrucel.  If no real change would recommend further labs including repeat CBC, CMP, TSH.  Would also discuss a product for constipation to see if having more regular bowel movements helps  her.  , PA-C [facility] 10/09/2022, 8:41 AM  Cc: 10/11/2022, PA-C

## 2022-10-21 DIAGNOSIS — R350 Frequency of micturition: Secondary | ICD-10-CM | POA: Diagnosis not present

## 2023-05-23 DIAGNOSIS — L819 Disorder of pigmentation, unspecified: Secondary | ICD-10-CM | POA: Diagnosis not present

## 2023-05-23 DIAGNOSIS — R002 Palpitations: Secondary | ICD-10-CM | POA: Diagnosis not present

## 2023-08-06 DIAGNOSIS — R42 Dizziness and giddiness: Secondary | ICD-10-CM | POA: Diagnosis not present

## 2023-08-23 DIAGNOSIS — M25562 Pain in left knee: Secondary | ICD-10-CM | POA: Diagnosis not present

## 2023-08-23 DIAGNOSIS — M25462 Effusion, left knee: Secondary | ICD-10-CM | POA: Diagnosis not present

## 2023-08-23 DIAGNOSIS — S8002XA Contusion of left knee, initial encounter: Secondary | ICD-10-CM | POA: Diagnosis not present

## 2023-09-09 DIAGNOSIS — S161XXA Strain of muscle, fascia and tendon at neck level, initial encounter: Secondary | ICD-10-CM | POA: Diagnosis not present

## 2023-09-09 DIAGNOSIS — Z20822 Contact with and (suspected) exposure to covid-19: Secondary | ICD-10-CM | POA: Diagnosis not present

## 2023-09-10 DIAGNOSIS — Z113 Encounter for screening for infections with a predominantly sexual mode of transmission: Secondary | ICD-10-CM | POA: Diagnosis not present

## 2023-09-10 DIAGNOSIS — Z114 Encounter for screening for human immunodeficiency virus [HIV]: Secondary | ICD-10-CM | POA: Diagnosis not present

## 2023-09-10 DIAGNOSIS — Z1159 Encounter for screening for other viral diseases: Secondary | ICD-10-CM | POA: Diagnosis not present

## 2023-09-10 DIAGNOSIS — Z01419 Encounter for gynecological examination (general) (routine) without abnormal findings: Secondary | ICD-10-CM | POA: Diagnosis not present

## 2023-10-25 DIAGNOSIS — M9905 Segmental and somatic dysfunction of pelvic region: Secondary | ICD-10-CM | POA: Diagnosis not present

## 2023-10-25 DIAGNOSIS — M5386 Other specified dorsopathies, lumbar region: Secondary | ICD-10-CM | POA: Diagnosis not present

## 2023-10-25 DIAGNOSIS — M9902 Segmental and somatic dysfunction of thoracic region: Secondary | ICD-10-CM | POA: Diagnosis not present

## 2023-10-25 DIAGNOSIS — M5417 Radiculopathy, lumbosacral region: Secondary | ICD-10-CM | POA: Diagnosis not present

## 2023-10-25 DIAGNOSIS — M9903 Segmental and somatic dysfunction of lumbar region: Secondary | ICD-10-CM | POA: Diagnosis not present

## 2023-10-25 DIAGNOSIS — M9904 Segmental and somatic dysfunction of sacral region: Secondary | ICD-10-CM | POA: Diagnosis not present

## 2023-10-28 DIAGNOSIS — M5417 Radiculopathy, lumbosacral region: Secondary | ICD-10-CM | POA: Diagnosis not present

## 2023-10-28 DIAGNOSIS — M9903 Segmental and somatic dysfunction of lumbar region: Secondary | ICD-10-CM | POA: Diagnosis not present

## 2023-10-28 DIAGNOSIS — M5386 Other specified dorsopathies, lumbar region: Secondary | ICD-10-CM | POA: Diagnosis not present

## 2023-10-28 DIAGNOSIS — M9904 Segmental and somatic dysfunction of sacral region: Secondary | ICD-10-CM | POA: Diagnosis not present

## 2023-10-28 DIAGNOSIS — M9905 Segmental and somatic dysfunction of pelvic region: Secondary | ICD-10-CM | POA: Diagnosis not present

## 2023-11-01 DIAGNOSIS — M9904 Segmental and somatic dysfunction of sacral region: Secondary | ICD-10-CM | POA: Diagnosis not present

## 2023-11-01 DIAGNOSIS — M5417 Radiculopathy, lumbosacral region: Secondary | ICD-10-CM | POA: Diagnosis not present

## 2023-11-01 DIAGNOSIS — M9903 Segmental and somatic dysfunction of lumbar region: Secondary | ICD-10-CM | POA: Diagnosis not present

## 2023-11-01 DIAGNOSIS — M9905 Segmental and somatic dysfunction of pelvic region: Secondary | ICD-10-CM | POA: Diagnosis not present

## 2023-11-01 DIAGNOSIS — M5386 Other specified dorsopathies, lumbar region: Secondary | ICD-10-CM | POA: Diagnosis not present

## 2023-11-06 DIAGNOSIS — M9904 Segmental and somatic dysfunction of sacral region: Secondary | ICD-10-CM | POA: Diagnosis not present

## 2023-11-06 DIAGNOSIS — M5417 Radiculopathy, lumbosacral region: Secondary | ICD-10-CM | POA: Diagnosis not present

## 2023-11-06 DIAGNOSIS — M9905 Segmental and somatic dysfunction of pelvic region: Secondary | ICD-10-CM | POA: Diagnosis not present

## 2023-11-06 DIAGNOSIS — M9903 Segmental and somatic dysfunction of lumbar region: Secondary | ICD-10-CM | POA: Diagnosis not present

## 2023-11-06 DIAGNOSIS — M5386 Other specified dorsopathies, lumbar region: Secondary | ICD-10-CM | POA: Diagnosis not present

## 2023-11-08 DIAGNOSIS — M5417 Radiculopathy, lumbosacral region: Secondary | ICD-10-CM | POA: Diagnosis not present

## 2023-11-08 DIAGNOSIS — M9905 Segmental and somatic dysfunction of pelvic region: Secondary | ICD-10-CM | POA: Diagnosis not present

## 2023-11-08 DIAGNOSIS — M5386 Other specified dorsopathies, lumbar region: Secondary | ICD-10-CM | POA: Diagnosis not present

## 2023-11-08 DIAGNOSIS — M9904 Segmental and somatic dysfunction of sacral region: Secondary | ICD-10-CM | POA: Diagnosis not present

## 2023-11-08 DIAGNOSIS — M9903 Segmental and somatic dysfunction of lumbar region: Secondary | ICD-10-CM | POA: Diagnosis not present

## 2023-11-11 DIAGNOSIS — M9903 Segmental and somatic dysfunction of lumbar region: Secondary | ICD-10-CM | POA: Diagnosis not present

## 2023-11-11 DIAGNOSIS — M5417 Radiculopathy, lumbosacral region: Secondary | ICD-10-CM | POA: Diagnosis not present

## 2023-11-11 DIAGNOSIS — M9904 Segmental and somatic dysfunction of sacral region: Secondary | ICD-10-CM | POA: Diagnosis not present

## 2023-11-11 DIAGNOSIS — M5386 Other specified dorsopathies, lumbar region: Secondary | ICD-10-CM | POA: Diagnosis not present

## 2023-11-11 DIAGNOSIS — M9905 Segmental and somatic dysfunction of pelvic region: Secondary | ICD-10-CM | POA: Diagnosis not present

## 2023-11-22 DIAGNOSIS — M5417 Radiculopathy, lumbosacral region: Secondary | ICD-10-CM | POA: Diagnosis not present

## 2023-11-22 DIAGNOSIS — M5386 Other specified dorsopathies, lumbar region: Secondary | ICD-10-CM | POA: Diagnosis not present

## 2023-11-22 DIAGNOSIS — M9903 Segmental and somatic dysfunction of lumbar region: Secondary | ICD-10-CM | POA: Diagnosis not present

## 2023-11-22 DIAGNOSIS — M9905 Segmental and somatic dysfunction of pelvic region: Secondary | ICD-10-CM | POA: Diagnosis not present

## 2023-11-28 DIAGNOSIS — M5417 Radiculopathy, lumbosacral region: Secondary | ICD-10-CM | POA: Diagnosis not present

## 2023-11-28 DIAGNOSIS — M9905 Segmental and somatic dysfunction of pelvic region: Secondary | ICD-10-CM | POA: Diagnosis not present

## 2023-11-28 DIAGNOSIS — M9903 Segmental and somatic dysfunction of lumbar region: Secondary | ICD-10-CM | POA: Diagnosis not present

## 2023-11-28 DIAGNOSIS — M5386 Other specified dorsopathies, lumbar region: Secondary | ICD-10-CM | POA: Diagnosis not present

## 2023-12-02 DIAGNOSIS — M5386 Other specified dorsopathies, lumbar region: Secondary | ICD-10-CM | POA: Diagnosis not present

## 2023-12-02 DIAGNOSIS — M9905 Segmental and somatic dysfunction of pelvic region: Secondary | ICD-10-CM | POA: Diagnosis not present

## 2023-12-02 DIAGNOSIS — M9903 Segmental and somatic dysfunction of lumbar region: Secondary | ICD-10-CM | POA: Diagnosis not present

## 2023-12-02 DIAGNOSIS — M5417 Radiculopathy, lumbosacral region: Secondary | ICD-10-CM | POA: Diagnosis not present

## 2023-12-09 DIAGNOSIS — M5417 Radiculopathy, lumbosacral region: Secondary | ICD-10-CM | POA: Diagnosis not present

## 2023-12-09 DIAGNOSIS — M9905 Segmental and somatic dysfunction of pelvic region: Secondary | ICD-10-CM | POA: Diagnosis not present

## 2023-12-09 DIAGNOSIS — M9903 Segmental and somatic dysfunction of lumbar region: Secondary | ICD-10-CM | POA: Diagnosis not present

## 2023-12-09 DIAGNOSIS — M5386 Other specified dorsopathies, lumbar region: Secondary | ICD-10-CM | POA: Diagnosis not present

## 2023-12-20 DIAGNOSIS — M9905 Segmental and somatic dysfunction of pelvic region: Secondary | ICD-10-CM | POA: Diagnosis not present

## 2023-12-20 DIAGNOSIS — M5386 Other specified dorsopathies, lumbar region: Secondary | ICD-10-CM | POA: Diagnosis not present

## 2023-12-20 DIAGNOSIS — M5417 Radiculopathy, lumbosacral region: Secondary | ICD-10-CM | POA: Diagnosis not present

## 2023-12-20 DIAGNOSIS — M9903 Segmental and somatic dysfunction of lumbar region: Secondary | ICD-10-CM | POA: Diagnosis not present

## 2023-12-30 DIAGNOSIS — M5386 Other specified dorsopathies, lumbar region: Secondary | ICD-10-CM | POA: Diagnosis not present

## 2023-12-30 DIAGNOSIS — M9905 Segmental and somatic dysfunction of pelvic region: Secondary | ICD-10-CM | POA: Diagnosis not present

## 2023-12-30 DIAGNOSIS — M9903 Segmental and somatic dysfunction of lumbar region: Secondary | ICD-10-CM | POA: Diagnosis not present

## 2023-12-30 DIAGNOSIS — M5417 Radiculopathy, lumbosacral region: Secondary | ICD-10-CM | POA: Diagnosis not present

## 2024-01-09 DIAGNOSIS — M9904 Segmental and somatic dysfunction of sacral region: Secondary | ICD-10-CM | POA: Diagnosis not present

## 2024-01-09 DIAGNOSIS — M9903 Segmental and somatic dysfunction of lumbar region: Secondary | ICD-10-CM | POA: Diagnosis not present

## 2024-01-09 DIAGNOSIS — M5417 Radiculopathy, lumbosacral region: Secondary | ICD-10-CM | POA: Diagnosis not present

## 2024-01-09 DIAGNOSIS — M5386 Other specified dorsopathies, lumbar region: Secondary | ICD-10-CM | POA: Diagnosis not present

## 2024-01-09 DIAGNOSIS — M9905 Segmental and somatic dysfunction of pelvic region: Secondary | ICD-10-CM | POA: Diagnosis not present

## 2024-01-23 DIAGNOSIS — M9903 Segmental and somatic dysfunction of lumbar region: Secondary | ICD-10-CM | POA: Diagnosis not present

## 2024-01-23 DIAGNOSIS — M5417 Radiculopathy, lumbosacral region: Secondary | ICD-10-CM | POA: Diagnosis not present

## 2024-01-23 DIAGNOSIS — M9905 Segmental and somatic dysfunction of pelvic region: Secondary | ICD-10-CM | POA: Diagnosis not present

## 2024-01-23 DIAGNOSIS — M5386 Other specified dorsopathies, lumbar region: Secondary | ICD-10-CM | POA: Diagnosis not present

## 2024-02-13 DIAGNOSIS — M9905 Segmental and somatic dysfunction of pelvic region: Secondary | ICD-10-CM | POA: Diagnosis not present

## 2024-02-13 DIAGNOSIS — M5417 Radiculopathy, lumbosacral region: Secondary | ICD-10-CM | POA: Diagnosis not present

## 2024-02-13 DIAGNOSIS — M5386 Other specified dorsopathies, lumbar region: Secondary | ICD-10-CM | POA: Diagnosis not present

## 2024-02-13 DIAGNOSIS — M9903 Segmental and somatic dysfunction of lumbar region: Secondary | ICD-10-CM | POA: Diagnosis not present

## 2024-03-05 DIAGNOSIS — R102 Pelvic and perineal pain: Secondary | ICD-10-CM | POA: Diagnosis not present

## 2024-03-05 DIAGNOSIS — Z1159 Encounter for screening for other viral diseases: Secondary | ICD-10-CM | POA: Diagnosis not present

## 2024-03-05 DIAGNOSIS — Z113 Encounter for screening for infections with a predominantly sexual mode of transmission: Secondary | ICD-10-CM | POA: Diagnosis not present

## 2024-03-05 DIAGNOSIS — Z114 Encounter for screening for human immunodeficiency virus [HIV]: Secondary | ICD-10-CM | POA: Diagnosis not present

## 2024-03-12 DIAGNOSIS — M9903 Segmental and somatic dysfunction of lumbar region: Secondary | ICD-10-CM | POA: Diagnosis not present

## 2024-03-12 DIAGNOSIS — M5386 Other specified dorsopathies, lumbar region: Secondary | ICD-10-CM | POA: Diagnosis not present

## 2024-03-12 DIAGNOSIS — M5417 Radiculopathy, lumbosacral region: Secondary | ICD-10-CM | POA: Diagnosis not present

## 2024-03-12 DIAGNOSIS — M9905 Segmental and somatic dysfunction of pelvic region: Secondary | ICD-10-CM | POA: Diagnosis not present

## 2024-09-08 DIAGNOSIS — G44209 Tension-type headache, unspecified, not intractable: Secondary | ICD-10-CM | POA: Diagnosis not present

## 2024-09-08 DIAGNOSIS — Z1389 Encounter for screening for other disorder: Secondary | ICD-10-CM | POA: Diagnosis not present

## 2024-09-08 DIAGNOSIS — K219 Gastro-esophageal reflux disease without esophagitis: Secondary | ICD-10-CM | POA: Diagnosis not present

## 2024-09-08 DIAGNOSIS — Z0000 Encounter for general adult medical examination without abnormal findings: Secondary | ICD-10-CM | POA: Diagnosis not present

## 2024-09-08 DIAGNOSIS — J302 Other seasonal allergic rhinitis: Secondary | ICD-10-CM | POA: Diagnosis not present

## 2024-09-08 DIAGNOSIS — R030 Elevated blood-pressure reading, without diagnosis of hypertension: Secondary | ICD-10-CM | POA: Diagnosis not present

## 2024-10-02 DIAGNOSIS — R002 Palpitations: Secondary | ICD-10-CM | POA: Diagnosis not present

## 2024-10-02 DIAGNOSIS — R030 Elevated blood-pressure reading, without diagnosis of hypertension: Secondary | ICD-10-CM | POA: Diagnosis not present

## 2024-10-02 DIAGNOSIS — R7303 Prediabetes: Secondary | ICD-10-CM | POA: Diagnosis not present

## 2025-02-10 ENCOUNTER — Ambulatory Visit: Payer: Self-pay | Admitting: Allergy
